# Patient Record
Sex: Male | Born: 1955
Health system: Southern US, Community
[De-identification: ages and names within clinical notes are randomized; demographics above are authoritative.]

## PROBLEM LIST (undated history)

## (undated) DIAGNOSIS — Z973 Presence of spectacles and contact lenses: Secondary | ICD-10-CM

## (undated) DIAGNOSIS — I340 Nonrheumatic mitral (valve) insufficiency: Secondary | ICD-10-CM

## (undated) DIAGNOSIS — E785 Hyperlipidemia, unspecified: Secondary | ICD-10-CM

## (undated) DIAGNOSIS — I10 Essential (primary) hypertension: Secondary | ICD-10-CM

## (undated) DIAGNOSIS — I351 Nonrheumatic aortic (valve) insufficiency: Secondary | ICD-10-CM

## (undated) DIAGNOSIS — K219 Gastro-esophageal reflux disease without esophagitis: Secondary | ICD-10-CM

## (undated) DIAGNOSIS — C801 Malignant (primary) neoplasm, unspecified: Secondary | ICD-10-CM

## (undated) DIAGNOSIS — R42 Dizziness and giddiness: Secondary | ICD-10-CM

## (undated) DIAGNOSIS — R011 Cardiac murmur, unspecified: Secondary | ICD-10-CM

## (undated) HISTORY — PX: OTHER SURGICAL HISTORY: SHX169

---

## 2013-08-12 ENCOUNTER — Other Ambulatory Visit (HOSPITAL_COMMUNITY): Payer: Self-pay | Admitting: Urology

## 2013-08-12 DIAGNOSIS — R972 Elevated prostate specific antigen [PSA]: Secondary | ICD-10-CM

## 2013-08-30 ENCOUNTER — Ambulatory Visit (HOSPITAL_COMMUNITY)
Admission: RE | Admit: 2013-08-30 | Discharge: 2013-08-30 | Disposition: A | Discharge status: Home / Self Care | Payer: 59 | Source: Ambulatory Visit | Attending: Urology | Admitting: Urology

## 2013-08-30 DIAGNOSIS — N402 Nodular prostate without lower urinary tract symptoms: Secondary | ICD-10-CM | POA: Insufficient documentation

## 2013-08-30 DIAGNOSIS — R972 Elevated prostate specific antigen [PSA]: Secondary | ICD-10-CM | POA: Insufficient documentation

## 2013-08-30 DIAGNOSIS — N400 Enlarged prostate without lower urinary tract symptoms: Secondary | ICD-10-CM | POA: Insufficient documentation

## 2013-08-30 LAB — CREATININE, SERUM
Creatinine, Ser: 1.43 mg/dL — ABNORMAL HIGH (ref 0.50–1.35)
GFR calc Af Amer: 62 mL/min — ABNORMAL LOW (ref >90)
GFR calc non Af Amer: 54 mL/min — ABNORMAL LOW (ref >90)

## 2013-08-30 MED ORDER — GADOBENATE DIMEGLUMINE 529 MG/ML IV SOLN
17.0000 mL | Freq: Once | INTRAVENOUS | Status: AC | PRN
  Administered 2013-08-30: 17 mL via INTRAVENOUS

## 2015-05-15 ENCOUNTER — Other Ambulatory Visit: Payer: Self-pay | Admitting: Urology

## 2015-05-18 ENCOUNTER — Other Ambulatory Visit: Payer: Self-pay | Admitting: Urology

## 2015-06-26 ENCOUNTER — Encounter (HOSPITAL_COMMUNITY)
Admission: RE | Admit: 2015-06-26 | Discharge: 2015-06-26 | Disposition: A | Discharge status: Home / Self Care | Payer: 59 | Source: Ambulatory Visit | Attending: Urology | Admitting: Urology

## 2015-06-26 ENCOUNTER — Encounter (HOSPITAL_COMMUNITY): Payer: Self-pay

## 2015-06-26 DIAGNOSIS — Z01812 Encounter for preprocedural laboratory examination: Secondary | ICD-10-CM | POA: Insufficient documentation

## 2015-06-26 HISTORY — DX: Presence of spectacles and contact lenses: Z97.3

## 2015-06-26 HISTORY — DX: Hyperlipidemia, unspecified: E78.5

## 2015-06-26 HISTORY — DX: Cardiac murmur, unspecified: R01.1

## 2015-06-26 HISTORY — DX: Dizziness and giddiness: R42

## 2015-06-26 HISTORY — DX: Nonrheumatic mitral (valve) insufficiency: I34.0

## 2015-06-26 HISTORY — DX: Malignant (primary) neoplasm, unspecified: C80.1

## 2015-06-26 HISTORY — DX: Nonrheumatic aortic (valve) insufficiency: I35.1

## 2015-06-26 HISTORY — DX: Gastro-esophageal reflux disease without esophagitis: K21.9

## 2015-06-26 HISTORY — DX: Essential (primary) hypertension: I10

## 2015-06-26 LAB — ABO/RH: ABO/RH(D): O POS

## 2015-06-26 LAB — BASIC METABOLIC PANEL
Anion gap: 10 (ref 5–15)
BUN: 33 mg/dL — ABNORMAL HIGH (ref 6–20)
CO2: 24 mmol/L (ref 22–32)
Calcium: 9.9 mg/dL (ref 8.9–10.3)
Chloride: 109 mmol/L (ref 101–111)
Creatinine, Ser: 1.27 mg/dL — ABNORMAL HIGH (ref 0.61–1.24)
GFR calc Af Amer: >60 mL/min (ref >60)
GFR calc non Af Amer: >60 mL/min (ref >60)
Glucose, Bld: 100 mg/dL — ABNORMAL HIGH (ref 65–99)
Potassium: 5 mmol/L (ref 3.5–5.1)
Sodium: 143 mmol/L (ref 135–145)

## 2015-06-26 LAB — CBC
HCT: 39 % (ref 39.0–52.0)
Hemoglobin: 13.2 g/dL (ref 13.0–17.0)
MCH: 31.2 pg (ref 26.0–34.0)
MCHC: 33.8 g/dL (ref 30.0–36.0)
MCV: 92.2 fL (ref 78.0–100.0)
Platelets: 213 10*3/uL (ref 150–400)
RBC: 4.23 MIL/uL (ref 4.22–5.81)
RDW: 12.1 % (ref 11.5–15.5)
WBC: 6 10*3/uL (ref 4.0–10.5)

## 2015-06-26 LAB — PSA: PSA: 14.5 ng/mL — ABNORMAL HIGH (ref 0.00–4.00)

## 2015-06-26 LAB — TYPE AND SCREEN
ABO/RH(D): O POS
Antibody Screen: NEGATIVE

## 2015-06-26 NOTE — Patient Instructions (Signed)
Steven Hanson  06/26/2015   Your procedure is scheduled on: Friday June 30, 2015  Report to [facility]  Entrance take [place]  elevators to 3rd floor to  [place] at 5:30 AM.  Call this number if you have problems the morning of surgery [phone]   Remember: ONLY 1 PERSON MAY GO WITH YOU TO SHORT STAY TO GET  READY MORNING OF [place].  Do not eat food or drink liquids :After Midnight.     Take these medicines the morning of surgery with A SIP OF WATER: NONE                               You may not have any metal on your body including hair pins and              piercings  Do not wear jewelry, lotions, powders or colognes, deodorant                           Men may shave face and neck.   Do not bring valuables to the hospital. [place].  Contacts, dentures or bridgework may not be worn into surgery.  Leave suitcase in the car. After surgery it may be brought to your room.             FOLLOW SURGEON'S INSTRUCTION IN REGARDS TO BOWEL PREPARATION PRIOR TO SURGICAL DATE   _____________________________________________________________________             [facility] - Preparing for Surgery Before surgery, you can play an important role.  Because skin is not sterile, your skin needs to be as free of germs as possible.  You can reduce the number of germs on your skin by washing with CHG (chlorahexidine gluconate) soap before surgery.  CHG is an antiseptic cleaner which kills germs and bonds with the skin to continue killing germs even after washing. Please DO NOT use if you have an allergy to CHG or antibacterial soaps.  If your skin becomes reddened/irritated stop using the CHG and inform your nurse when you arrive at Short Stay. Do not shave (including legs and underarms) for at least 48 hours prior to the first CHG shower.  You may shave your face/neck. Please follow these instructions  carefully:  1.  Shower with CHG Soap the night before surgery and the  morning of Surgery.  2.  If you choose to wash your hair, wash your hair first as usual with your  normal  shampoo.  3.  After you shampoo, rinse your hair and body thoroughly to remove the  shampoo.                           4.  Use CHG as you would any other liquid soap.  You can apply chg directly  to the skin and wash                       Gently with a scrungie or clean washcloth.  5.  Apply the CHG Soap to your body ONLY FROM THE NECK DOWN.   Do not use on face/  open                           Wound or open sores. Avoid contact with eyes, ears mouth and genitals (private parts).                       Wash face,  Genitals (private parts) with your normal soap.             6.  Wash thoroughly, paying special attention to the area where your surgery  will be performed.  7.  Thoroughly rinse your body with warm water from the neck down.  8.  DO NOT shower/wash with your normal soap after using and rinsing off  the CHG Soap.                9.  Pat yourself dry with a clean towel.            10.  Wear clean pajamas.            11.  Place clean sheets on your bed the night of your first shower and do not  sleep with pets. Day of Surgery : Do not apply any lotions/deodorants the morning of surgery.  Please wear clean clothes to the hospital/surgery center.  FAILURE TO FOLLOW THESE INSTRUCTIONS MAY RESULT IN THE CANCELLATION OF YOUR SURGERY PATIENT SIGNATURE_________________________________  NURSE SIGNATURE__________________________________  ________________________________________________________________________

## 2015-06-26 NOTE — Progress Notes (Signed)
H&P per Dr Wynonia Lawman per chart 11/11/2014  ECHO results per chart 10/17/2014  EKG per chart 11/11/2014

## 2015-06-27 LAB — URINE CULTURE: Culture: 2000

## 2015-06-27 NOTE — Progress Notes (Addendum)
BMP and PSA results in epic per PAT visit 06/26/2015 sent to Dr Louis Meckel

## 2015-06-29 NOTE — Anesthesia Preprocedure Evaluation (Addendum)
Anesthesia Evaluation  Patient identified by MRN, date of birth, ID band Patient awake    Reviewed: Allergy & Precautions, NPO status , Patient's Chart, lab work & pertinent test results  History of Anesthesia Complications Negative for: history of anesthetic complications  Airway Mallampati: II  TM Distance: >3 FB Neck ROM: Full    Dental  (+) Teeth Intact, Dental Advisory Given   Pulmonary neg pulmonary ROS,    Pulmonary exam normal        Cardiovascular hypertension, Pt. on medications Normal cardiovascular exam+ Valvular Problems/Murmurs MR and AI   Echo: Mild- mod MR, Mild- mod AI, nl EF.   Neuro/Psych negative neurological ROS  negative psych ROS   GI/Hepatic Neg liver ROS, GERD  Medicated,  Endo/Other  negative endocrine ROS  Renal/GU Renal InsufficiencyRenal disease     Musculoskeletal   Abdominal   Peds  Hematology   Anesthesia Other Findings   Reproductive/Obstetrics                         Anesthesia Physical Anesthesia Plan  ASA: III  Anesthesia Plan: General   Post-op Pain Management:    Induction: Intravenous  Airway Management Planned: Oral ETT  Additional Equipment:   Intra-op Plan:   Post-operative Plan: Extubation in OR  Informed Consent: I have reviewed the patients History and Physical, chart, labs and discussed the procedure including the risks, benefits and alternatives for the proposed anesthesia with the patient or authorized representative who has indicated his/her understanding and acceptance.   Dental advisory given  Plan Discussed with:   Anesthesia Plan Comments:        Anesthesia Quick Evaluation

## 2015-06-30 ENCOUNTER — Inpatient Hospital Stay (HOSPITAL_COMMUNITY): Payer: 59 | Admitting: Anesthesiology

## 2015-06-30 ENCOUNTER — Encounter (HOSPITAL_COMMUNITY): Payer: Self-pay

## 2015-06-30 ENCOUNTER — Encounter (HOSPITAL_COMMUNITY)
Admission: RE | Disposition: A | Discharge status: Home / Self Care | Payer: Self-pay | Source: Ambulatory Visit | Attending: Urology

## 2015-06-30 ENCOUNTER — Inpatient Hospital Stay (HOSPITAL_COMMUNITY)
Admission: RE | Admit: 2015-06-30 | Discharge: 2015-07-01 | DRG: 708 | Disposition: A | Discharge status: Home / Self Care | Payer: 59 | Source: Ambulatory Visit | Attending: Urology | Admitting: Urology

## 2015-06-30 DIAGNOSIS — Z79899 Other long term (current) drug therapy: Secondary | ICD-10-CM

## 2015-06-30 DIAGNOSIS — I10 Essential (primary) hypertension: Secondary | ICD-10-CM | POA: Diagnosis present

## 2015-06-30 DIAGNOSIS — K219 Gastro-esophageal reflux disease without esophagitis: Secondary | ICD-10-CM | POA: Diagnosis present

## 2015-06-30 DIAGNOSIS — Z01812 Encounter for preprocedural laboratory examination: Secondary | ICD-10-CM

## 2015-06-30 DIAGNOSIS — Z8249 Family history of ischemic heart disease and other diseases of the circulatory system: Secondary | ICD-10-CM

## 2015-06-30 DIAGNOSIS — C61 Malignant neoplasm of prostate: Secondary | ICD-10-CM | POA: Diagnosis present

## 2015-06-30 HISTORY — PX: LYMPH NODE DISSECTION: SHX5087

## 2015-06-30 HISTORY — PX: ROBOT ASSISTED LAPAROSCOPIC RADICAL PROSTATECTOMY: SHX5141

## 2015-06-30 LAB — HEMOGLOBIN AND HEMATOCRIT, BLOOD
HCT: 33.9 % — ABNORMAL LOW (ref 39.0–52.0)
Hemoglobin: 11.6 g/dL — ABNORMAL LOW (ref 13.0–17.0)

## 2015-06-30 SURGERY — ROBOTIC ASSISTED LAPAROSCOPIC RADICAL PROSTATECTOMY
Anesthesia: General

## 2015-06-30 MED ORDER — EPHEDRINE SULFATE 50 MG/ML IJ SOLN
INTRAMUSCULAR | Status: AC
  Filled 2015-06-30: qty 1

## 2015-06-30 MED ORDER — FENTANYL CITRATE (PF) 100 MCG/2ML IJ SOLN
INTRAMUSCULAR | Status: DC | PRN
  Administered 2015-06-30: 50 ug via INTRAVENOUS
  Administered 2015-06-30: 100 ug via INTRAVENOUS
  Administered 2015-06-30 (×3): 50 ug via INTRAVENOUS
  Administered 2015-06-30: 100 ug via INTRAVENOUS

## 2015-06-30 MED ORDER — LIDOCAINE HCL (CARDIAC) 20 MG/ML IV SOLN
INTRAVENOUS | Status: DC | PRN
  Administered 2015-06-30: 50 mg via INTRAVENOUS

## 2015-06-30 MED ORDER — SCOPOLAMINE 1 MG/3DAYS TD PT72
MEDICATED_PATCH | TRANSDERMAL | Status: AC
  Filled 2015-06-30: qty 1

## 2015-06-30 MED ORDER — SODIUM CHLORIDE 0.9 % IV BOLUS (SEPSIS)
1000.0000 mL | Freq: Once | INTRAVENOUS | Status: AC
  Administered 2015-06-30: 1000 mL via INTRAVENOUS

## 2015-06-30 MED ORDER — MIDAZOLAM HCL 2 MG/2ML IJ SOLN
INTRAMUSCULAR | Status: AC
  Filled 2015-06-30: qty 2

## 2015-06-30 MED ORDER — PANTOPRAZOLE SODIUM 40 MG PO TBEC
40.0000 mg | DELAYED_RELEASE_TABLET | Freq: Every day | ORAL | Status: DC
  Administered 2015-06-30 – 2015-07-01 (×2): 40 mg via ORAL
  Filled 2015-06-30 (×2): qty 1

## 2015-06-30 MED ORDER — HYDROMORPHONE HCL 1 MG/ML IJ SOLN
INTRAMUSCULAR | Status: AC
  Filled 2015-06-30: qty 1

## 2015-06-30 MED ORDER — DIPHENHYDRAMINE HCL 12.5 MG/5ML PO ELIX: 12.5000 mg | ORAL_SOLUTION | Freq: Four times a day (QID) | ORAL | Status: DC | PRN

## 2015-06-30 MED ORDER — EPHEDRINE SULFATE 50 MG/ML IJ SOLN
INTRAMUSCULAR | Status: DC | PRN
  Administered 2015-06-30: 10 mg via INTRAVENOUS

## 2015-06-30 MED ORDER — CEFAZOLIN SODIUM-DEXTROSE 2-3 GM-% IV SOLR
INTRAVENOUS | Status: AC
  Filled 2015-06-30: qty 50

## 2015-06-30 MED ORDER — ACETAMINOPHEN 500 MG PO TABS
1000.0000 mg | ORAL_TABLET | Freq: Four times a day (QID) | ORAL | Status: AC
Start: 2015-06-30 — End: 2015-07-01
  Administered 2015-06-30 – 2015-07-01 (×4): 1000 mg via ORAL
  Filled 2015-06-30 (×4): qty 2

## 2015-06-30 MED ORDER — FENTANYL CITRATE (PF) 250 MCG/5ML IJ SOLN
INTRAMUSCULAR | Status: AC
  Filled 2015-06-30: qty 5

## 2015-06-30 MED ORDER — PROPOFOL 10 MG/ML IV BOLUS
INTRAVENOUS | Status: AC
  Filled 2015-06-30: qty 40

## 2015-06-30 MED ORDER — BUPIVACAINE LIPOSOME 1.3 % IJ SUSP
20.0000 mL | Freq: Once | INTRAMUSCULAR | Status: AC
  Administered 2015-06-30: 20 mL
  Filled 2015-06-30: qty 20

## 2015-06-30 MED ORDER — PROMETHAZINE HCL 25 MG/ML IJ SOLN: 6.2500 mg | INTRAMUSCULAR | Status: DC | PRN

## 2015-06-30 MED ORDER — PROPOFOL 10 MG/ML IV BOLUS
INTRAVENOUS | Status: DC | PRN
  Administered 2015-06-30: 160 mg via INTRAVENOUS

## 2015-06-30 MED ORDER — CEFAZOLIN SODIUM-DEXTROSE 2-3 GM-% IV SOLR
2.0000 g | INTRAVENOUS | Status: AC
  Administered 2015-06-30: 2 g via INTRAVENOUS

## 2015-06-30 MED ORDER — PRAVASTATIN SODIUM 20 MG PO TABS
20.0000 mg | ORAL_TABLET | Freq: Every day | ORAL | Status: DC
  Administered 2015-06-30: 20 mg via ORAL
  Filled 2015-06-30: qty 1

## 2015-06-30 MED ORDER — HYDROMORPHONE HCL 1 MG/ML IJ SOLN
0.2500 mg | INTRAMUSCULAR | Status: DC | PRN
  Administered 2015-06-30 (×2): 0.5 mg via INTRAVENOUS
  Administered 2015-06-30: 0.25 mg via INTRAVENOUS
  Administered 2015-06-30: 0.5 mg via INTRAVENOUS
  Administered 2015-06-30: 0.25 mg via INTRAVENOUS

## 2015-06-30 MED ORDER — LACTATED RINGERS IV SOLN
INTRAVENOUS | Status: DC | PRN
  Administered 2015-06-30: 08:00:00 via INTRAVENOUS

## 2015-06-30 MED ORDER — SODIUM CHLORIDE 0.9 % IJ SOLN
INTRAMUSCULAR | Status: AC
  Filled 2015-06-30: qty 20

## 2015-06-30 MED ORDER — LIDOCAINE HCL (CARDIAC) 20 MG/ML IV SOLN
INTRAVENOUS | Status: AC
  Filled 2015-06-30: qty 5

## 2015-06-30 MED ORDER — OXYCODONE HCL 5 MG PO TABS
5.0000 mg | ORAL_TABLET | ORAL | Status: DC | PRN
  Administered 2015-06-30: 5 mg via ORAL
  Filled 2015-06-30: qty 1

## 2015-06-30 MED ORDER — BUPIVACAINE-EPINEPHRINE 0.25% -1:200000 IJ SOLN
INTRAMUSCULAR | Status: DC | PRN
  Administered 2015-06-30: 10 mL

## 2015-06-30 MED ORDER — KETOROLAC TROMETHAMINE 15 MG/ML IJ SOLN
15.0000 mg | Freq: Four times a day (QID) | INTRAMUSCULAR | Status: DC | PRN
  Administered 2015-07-01: 15 mg via INTRAVENOUS
  Filled 2015-06-30: qty 1

## 2015-06-30 MED ORDER — ONDANSETRON HCL 4 MG/2ML IJ SOLN
INTRAMUSCULAR | Status: DC | PRN
  Administered 2015-06-30: 4 mg via INTRAVENOUS

## 2015-06-30 MED ORDER — SUGAMMADEX SODIUM 500 MG/5ML IV SOLN
INTRAVENOUS | Status: DC | PRN
  Administered 2015-06-30: 350 mg via INTRAVENOUS

## 2015-06-30 MED ORDER — CIPROFLOXACIN IN D5W 400 MG/200ML IV SOLN
INTRAVENOUS | Status: AC
  Filled 2015-06-30: qty 200

## 2015-06-30 MED ORDER — BACITRACIN-NEOMYCIN-POLYMYXIN 400-5-5000 EX OINT
1.0000 "application " | TOPICAL_OINTMENT | Freq: Three times a day (TID) | CUTANEOUS | Status: DC | PRN
  Administered 2015-06-30: 1 via TOPICAL
  Filled 2015-06-30: qty 1

## 2015-06-30 MED ORDER — MIDAZOLAM HCL 5 MG/5ML IJ SOLN
INTRAMUSCULAR | Status: DC | PRN
  Administered 2015-06-30: 2 mg via INTRAVENOUS

## 2015-06-30 MED ORDER — CIPROFLOXACIN IN D5W 400 MG/200ML IV SOLN
400.0000 mg | INTRAVENOUS | Status: AC
  Administered 2015-06-30: 400 mg via INTRAVENOUS

## 2015-06-30 MED ORDER — LACTATED RINGERS IV SOLN
INTRAVENOUS | Status: DC | PRN
  Administered 2015-06-30 (×3): via INTRAVENOUS

## 2015-06-30 MED ORDER — HYDROCODONE-ACETAMINOPHEN 5-325 MG PO TABS: 1.0000 | ORAL_TABLET | Freq: Four times a day (QID) | ORAL | Status: DC | PRN

## 2015-06-30 MED ORDER — HYDROMORPHONE HCL 1 MG/ML IJ SOLN: 0.5000 mg | INTRAMUSCULAR | Status: DC | PRN

## 2015-06-30 MED ORDER — ONDANSETRON HCL 4 MG/2ML IJ SOLN
INTRAMUSCULAR | Status: AC
  Filled 2015-06-30: qty 2

## 2015-06-30 MED ORDER — ACETAMINOPHEN 10 MG/ML IV SOLN
1000.0000 mg | Freq: Four times a day (QID) | INTRAVENOUS | Status: DC
  Filled 2015-06-30 (×3): qty 100

## 2015-06-30 MED ORDER — SUGAMMADEX SODIUM 500 MG/5ML IV SOLN
INTRAVENOUS | Status: AC
  Filled 2015-06-30: qty 5

## 2015-06-30 MED ORDER — ROCURONIUM BROMIDE 100 MG/10ML IV SOLN
INTRAVENOUS | Status: DC | PRN
  Administered 2015-06-30 (×2): 20 mg via INTRAVENOUS
  Administered 2015-06-30: 30 mg via INTRAVENOUS
  Administered 2015-06-30: 50 mg via INTRAVENOUS

## 2015-06-30 MED ORDER — DEXTROSE-NACL 5-0.45 % IV SOLN
INTRAVENOUS | Status: DC
  Administered 2015-06-30 – 2015-07-01 (×3): via INTRAVENOUS

## 2015-06-30 MED ORDER — ONDANSETRON HCL 4 MG/2ML IJ SOLN
4.0000 mg | INTRAMUSCULAR | Status: DC | PRN
Start: 2015-06-30 — End: 2015-07-01

## 2015-06-30 MED ORDER — LACTATED RINGERS IR SOLN
Status: DC | PRN
  Administered 2015-06-30: 1000 mL

## 2015-06-30 MED ORDER — DIPHENHYDRAMINE HCL 50 MG/ML IJ SOLN: 12.5000 mg | Freq: Four times a day (QID) | INTRAMUSCULAR | Status: DC | PRN

## 2015-06-30 MED ORDER — SULFAMETHOXAZOLE-TRIMETHOPRIM 800-160 MG PO TABS: 1.0000 | ORAL_TABLET | Freq: Two times a day (BID) | ORAL | Status: DC

## 2015-06-30 MED ORDER — BUPIVACAINE-EPINEPHRINE (PF) 0.25% -1:200000 IJ SOLN
INTRAMUSCULAR | Status: AC
  Filled 2015-06-30: qty 30

## 2015-06-30 MED ORDER — SODIUM CHLORIDE 0.9 % IJ SOLN
INTRAMUSCULAR | Status: AC
  Filled 2015-06-30: qty 10

## 2015-06-30 MED ORDER — LOSARTAN POTASSIUM 50 MG PO TABS
50.0000 mg | ORAL_TABLET | Freq: Every day | ORAL | Status: DC
  Administered 2015-06-30 – 2015-07-01 (×2): 50 mg via ORAL
  Filled 2015-06-30 (×2): qty 1

## 2015-06-30 SURGICAL SUPPLY — 49 items
CATH FOLEY 2WAY SLVR 18FR 30CC (CATHETERS) ×3 IMPLANT
CATH ROBINSON RED A/P 16FR (CATHETERS) IMPLANT
CATH TIEMANN FOLEY 18FR 5CC (CATHETERS) ×3 IMPLANT
CHLORAPREP W/TINT 26ML (MISCELLANEOUS) ×3 IMPLANT
CLIP LIGATING HEM O LOK PURPLE (MISCELLANEOUS) ×6 IMPLANT
COVER SURGICAL LIGHT HANDLE (MISCELLANEOUS) ×3 IMPLANT
COVER TIP SHEARS 8 DVNC (MISCELLANEOUS) ×2 IMPLANT
COVER TIP SHEARS 8MM DA VINCI (MISCELLANEOUS) ×1
CUTTER ECHEON FLEX ENDO 45 340 (ENDOMECHANICALS) ×3 IMPLANT
DECANTER SPIKE VIAL GLASS SM (MISCELLANEOUS) ×3 IMPLANT
DRAPE ARM DVNC X/XI (DISPOSABLE) ×8 IMPLANT
DRAPE COLUMN DVNC XI (DISPOSABLE) ×2 IMPLANT
DRAPE DA VINCI XI ARM (DISPOSABLE) ×4
DRAPE DA VINCI XI COLUMN (DISPOSABLE) ×1
DRAPE LAPAROSCOPIC ABDOMINAL (DRAPES) IMPLANT
DRAPE SURG IRRIG POUCH 19X23 (DRAPES) ×3 IMPLANT
DRSG TEGADERM 4X4.75 (GAUZE/BANDAGES/DRESSINGS) ×3 IMPLANT
ELECT REM PT RETURN 9FT ADLT (ELECTROSURGICAL) ×3
ELECTRODE REM PT RTRN 9FT ADLT (ELECTROSURGICAL) ×2 IMPLANT
GAUZE SPONGE 2X2 8PLY STRL LF (GAUZE/BANDAGES/DRESSINGS) IMPLANT
GLOVE BIO SURGEON STRL SZ 6.5 (GLOVE) ×3 IMPLANT
GLOVE BIOGEL M STRL SZ7.5 (GLOVE) ×6 IMPLANT
GOWN STRL REUS W/TWL LRG LVL3 (GOWN DISPOSABLE) ×3 IMPLANT
GOWN STRL REUS W/TWL XL LVL3 (GOWN DISPOSABLE) ×6 IMPLANT
HEMOSTAT SURGICEL 4X8 (HEMOSTASIS) ×3 IMPLANT
HOLDER FOLEY CATH W/STRAP (MISCELLANEOUS) ×3 IMPLANT
IV LACTATED RINGERS 1000ML (IV SOLUTION) ×3 IMPLANT
LIQUID BAND (GAUZE/BANDAGES/DRESSINGS) ×3 IMPLANT
PACK ROBOT UROLOGY CUSTOM (CUSTOM PROCEDURE TRAY) ×3 IMPLANT
PAD POSITIONING PINK XL (MISCELLANEOUS) ×3 IMPLANT
RELOAD GREEN ECHELON 45 (STAPLE) ×3 IMPLANT
SEAL CANN UNIV 5-8 DVNC XI (MISCELLANEOUS) ×8 IMPLANT
SEAL XI 5MM-8MM UNIVERSAL (MISCELLANEOUS) ×4
SET TUBE IRRIG SUCTION NO TIP (IRRIGATION / IRRIGATOR) ×3 IMPLANT
SOLUTION ELECTROLUBE (MISCELLANEOUS) ×3 IMPLANT
SPONGE GAUZE 2X2 STER 10/PKG (GAUZE/BANDAGES/DRESSINGS)
STAPLER 45 WHITE RELOAD XI (STAPLE)
STAPLER 45 WHT RELOAD XI (STAPLE) IMPLANT
SUT ETHILON 3 0 PS 1 (SUTURE) ×3 IMPLANT
SUT MNCRL AB 4-0 PS2 18 (SUTURE) ×6 IMPLANT
SUT VIC AB 0 CT1 27 (SUTURE) ×1
SUT VIC AB 0 CT1 27XBRD ANTBC (SUTURE) ×2 IMPLANT
SUT VIC AB 2-0 SH 27 (SUTURE) ×1
SUT VIC AB 2-0 SH 27X BRD (SUTURE) ×2 IMPLANT
SUT VICRYL 0 UR6 27IN ABS (SUTURE) ×6 IMPLANT
SUT VLOC BARB 180 ABS3/0GR12 (SUTURE) ×6
SUTURE VLOC BRB 180 ABS3/0GR12 (SUTURE) ×4 IMPLANT
TOWEL OR NON WOVEN STRL DISP B (DISPOSABLE) ×6 IMPLANT
WATER STERILE IRR 1500ML POUR (IV SOLUTION) ×3 IMPLANT

## 2015-06-30 NOTE — H&P (Signed)
Reason For Visit Follow-up for prostate cancer discussion   History of Present Illness This is a 60 year old male referred by Dr. Leighton Ruff, M.D. He is on active surveillance for CaP.    The patient has had 2 negative prostate biopsies in [place]. The first biopsy was performed on 07/03/12, this was a 12 core biopsy. Findings include high-grade PIN in the right lateral apex, left medial base, and left medial mid sections. In addition, ASAP in the left lateral apex. Repeat biopsy was performed on 10/05/12. Findings include small focus of atypical glands in the right lateral apex.     Prostate MR, 3/15, prior to repeat biopsy did not reveal any suspicious areas.    Oncotype DX 4/15: places patient in low risk (76% of favorable pathology) - less favorable than D'Amico criteria.    PSA History:  11.35 on 04/03/15  9.37 on 09/26/14  8.92 on 03/25/14  7.82 (8%) on 08/03/13 (biopsy - gleason 6 in 3/19 cores)  6.45 on 07/15/14  4.9 on 06/2012   4.0 on 10/2011  2.77 on 03/2010  1.38 on 01/2008  1.42 on 03/2007    IPSS: 2, QoL 1  SHIM: 21   Prostate biopsy results  09/2013: Stage: T1c PSA: 7.82, Biopsy , 3 /19 cores positive: Right transitional zone and apex 3+3 (5%), left medial base 3+3 (<5%), left medial mid 3+3 (10%)  Prostate volume: 27 g  04/05/15:1/12 cores positive, left medial mid, Gleason 3+4 equal 7 (10% 4)    Interval:   The patient presents today for discussion of his intermediate risk prostate cancer. He recovered well from his prostate biopsy. He denies any lingering hematuria or pain. Denies any fevers or chills.   Past Medical History Problems  1. History of esophageal reflux (Z87.19) 2. History of hypertension (Z86.79)  Current Meds 1. Losartan Potassium 50 MG Oral Tablet;  Therapy: (Recorded:24Feb2015) to Recorded 2. Omega 3 CAPS;  Therapy: (Recorded:26Mar2015) to Recorded 3. Pravastatin Sodium 20 MG Oral Tablet;  Therapy:  (Recorded:24Feb2015) to Recorded 4. PriLOSEC OTC TBEC;  Therapy: (Recorded:24Feb2015) to Recorded  Allergies Medication  1. No Known Drug Allergies  Family History Problems  1. Family history of Aneurysm : Father 2. Family history of hypertension (Z82.49) : Mother, Father 3. Family history of stroke (Z82.3) : Mother  Social History Problems  1. Alcohol use 2. Caffeine use (F15.90) 3. Death in the family, father   age 27 4. Death in the family, mother   age 12 stroke 35. Four children 6. Married 7. Never a smoker 8. Occupation   Manufacturing engineer  Review of Systems No changes in pts bowel habits, neurological changes, or progressive lower urinary tract symptoms.      Physical Exam Constitutional: Well nourished and well developed . No acute distress.  ENT:. The ears and nose are normal in appearance.  Neck: The appearance of the neck is normal and no neck mass is present.  Pulmonary: No respiratory distress and normal respiratory rhythm and effort.  Cardiovascular: Heart rate and rhythm are normal . No peripheral edema.  Abdomen: The abdomen is soft and nontender. No masses are palpated. No CVA tenderness. No hernias are palpable. No hepatosplenomegaly noted.  Lymphatics: The femoral and inguinal nodes are not enlarged or tender.  Skin: Normal skin turgor, no visible rash and no visible skin lesions.  Neuro/Psych:. Mood and affect are appropriate.    Results/Data Urine [Data Includes: Last 1 Day]   [id number]  COLOR YELLOW   APPEARANCE CLEAR  SPECIFIC GRAVITY 1.020   pH 6.5   GLUCOSE NEGATIVE   BILIRUBIN NEGATIVE   KETONE NEGATIVE   BLOOD NEGATIVE   PROTEIN NEGATIVE   NITRITE NEGATIVE   LEUKOCYTE ESTERASE NEGATIVE    Normal urine analysis   Assessment Assessed  1. Prostate cancer (C61)  Plan Health Maintenance  1. UA With REFLEX; [Do Not Release]; Status:Complete;   DoneOQ:[id number] 03:50PM  Discussion/Summary The patient has a history of a  rising PSA, and has been rapidly rising over the past 24-36 months. Nearly 6 months ago, the patient had a prostate biopsy demonstrating Gleason 3+3 equal 6 and 3/19 cores. He was placed on active surveillance. Repeat biopsy demonstrated Gleason 3+4 equal 7 prostate cancer. The patient is reasonably young and very healthy. We discussed the treatment options for low volume intermediate risk prostate cancer. Ultimately, given the patient's young age, I recommended that he consider treatment. We discussed the treatment modalities, specifically focusing on surgery. We went over the [facility] nomogram with his variables. He understands that with surgery has a 99% survival rate over the next 10 years. He also understands that there is a proximal 20% chance of biochemical recurrence of the next 10 years. We also discussed brachytherapy. I explained to him the risk and benefits of both procedure, which there are many of. Ultimately, I think the patient has opted to have a robotic-assisted laparoscopic prostatectomy.      We discussed prostatectomy and specifically robotic prostatectomy with bilateral pelvic lymphadenectomy being the technique that I most commonly perform. I showed the patient on their abdomen the approximately 6 small incision (trocar) sites as well as presumed extraction sites with robotic approach as well as possible open incision sites should open conversion be necessary. We discussed peri-operative risks including bleeding, infection, deep vein thrombosis, pulmonary embolism, compartment syndrome, nuropathy / neuropraxia, heart attack, stroke, death, as well as long-term risks such as non-cure / need for additional therapy. We specifically addressed that the procedure would compromise urinary control leading to stress incontinence which typically resolves with time and pelvic rehabilitation (Kegel's, etc..), but can sometimes be permanent and require additional therapy including  surgery. We also specifically addressed sexual sequellae including significant erectile dysfunction which typically partially resolves with time but can also be permanent and require additional therapy including surgery.     We discussed the typical hospital course including usual 1-2 night hospitalization, discharge with foley catheter in place usually for 1-2 weeks before voiding trial as well as usually 2 week recovery until able to perform most non-strenuous activity and 6 weeks until able to return to most jobs and more strenuous activity such as exercise.

## 2015-06-30 NOTE — Op Note (Signed)
Preoperative diagnosis:  1. Prostate Cancer   Postoperative diagnosis:  1. same   Procedure: 1. Robotic assisted laparoscopic radical prostatectomy 2. Bilateral pelvic lymph node dissection  Surgeon: Ardis Hughs, MD First Assistant: Debbrah Alar, PA  Anesthesia: General  Complications: None  Intraoperative findings:   EBL: 650  Specimens:  #1.  Prostate and seminal vesicals #2.  Bilateral pelvic lymph nodes  Indication: Steven Hanson is a 60 y.o. patient with prostate cancer.  After reviewing the management options for treatment, he elected to proceed with the removal of his prostate. We have discussed the potential benefits and risks of the procedure, side effects of the proposed treatment, the likelihood of the patient achieving the goals of the procedure, and any potential problems that might occur during the procedure or recuperation. Informed consent has been obtained.  Description of procedure: [name] posterior plane was adherent to the rectal wall likely due to the repeat biopsies.  In particular, the capsule was violated on the patient's left posterior plane in the mid portion.  I did suture the prostate in this area once out of the body to try and minimize the risk of any positive margins.   The patient was consented in the preoperative holding area. He is in brought back to the operating room placed the table in supine position. General anesthesia was then induced and endotracheal tube was inserted. He was then placed in dorsolithotomy position and placed in steep Trendelenburg. He was then prepped and draped in the routine sterile fashion. We then began by making a 12 mm incision supraumbilical midline incision the skin down through into the peritoneum. Then placed a 12 mm trocar. I then inflated the abdomen and inserted the 0 robotic lens. We then placed 2 additional a 8 millimeter trochars in the patient's left lower abdomen proximally 9 cm apart and 2  trochars on the patient's right lower abdomen, one was an 8 mm trocar and the one most lateral was a 12 mm trocar which was used as the assistant port. A 5 mm trocar was placed by triangulating the 2 right lateral ports as a second assistant port. These ports were all placed under visual guidance. Once the ports were noted to be satisfactory position the robot was docked. We started with the 0 lens, monopolar scissors in the right hand and the [place] forceps the left hand as well as a fenestrated grasper as the third arm on the left-hand side.   We began our dissection the posterior plane incising the peritoneum at the level of the vas deferens. Isolated the left vas deferens and dissected it proximally towards the spermatic cord for 5 cm prior to ligating it. Then used this as traction to isolate the left the seminal vesicle which was then undressed bluntly and completely dissected out, all vessels were cauterized with a combination of bipolar and the monopolar scissors. We then turned our attention to the right side and similarly dissected out the right vas deferens and seminal vesicle. Once the SVs had been freed, we turned our attention to the posterior plane and bluntly dissected the tissue between the rectum and the posterior wall of the prostate bluntly out towards the apex.    At this point the bladder was taken down starting at the urachal remnant with a combination of both blunt dissection and sharp dissection using monopolar cautery the bladder was dropped down in the usual fashion to the medial umbilical ligaments laterally and the dorsal vein of the prostate  anteriorly creating our space of Retzius. We then turned our attention to the endopelvic fascia which was incised laterally starting on the patient's right-hand side the levator muscles were pushed off the prostate laterally up towards the dorsal vein complex on the right-hand side. This process was then repeated on the left-hand side and a  nice notch was created for the dorsal vein. I then used a 62mm stapler to staple the dorsal vein.   We then located the bladder neck at the vesicoprostatic junction and using the monopolar scissors dissected down through the perivesical tissues and the bladder neck down to the prostatic urethra. The catheter was then deflated and pulled through our urethral opening and then used to retract the prostate anteriorly for the posterior bladder neck dissection. Once through the bladder neck and into the posterior plane of the prostate, the SVs were brought through the opening. The left pedicle was then isolated and systematically ligated with Weck clips and scissors. The nerve bundle was then peeled off the posterior lateral aspect of the prostate and bluntly dissected away off the prostate.  This was then repeated on the right side.    I then came down through the dorsal venous complex anteriorly down to the membranous urethra using the monopolar. Once down to the urethra, it was transected sharply and the apex of the prostate was then dissected off the levator and rectourethralis muscles. Once the apex of the prostate had been dissected free we came back to the base of the prostate and bluntly push the rectum and nerve vascular bundle off the prostate the patient's left and used clips on the patient's right to free the prostate. Once the prostate was free it was placed off to the side. The pelvis was then irrigated with normal saline and noted to be relatively hemostatic.  Attention was then turned to the right pelvic sidewall. The fibrofatty tissue between the external iliac vein, confluence of the iliac vessels, hypogastric artery, and Cooper's ligament was dissected free from the pelvic sidewall with care to preserve the obturator nerve. Weck clips were used for lymphostasis and hemostasis. An identical procedure was performed on the contralateral side and the lymphatic packets were removed for permanent  pathologic analysis.  The prostate and both lymph node tissues were placed in the Endo Catch bag and the string brought to the 5 mm port.    The vesicourethral anastomosis was then completed with 2 interlocking 3-0 V. lock sutures running the anastomosis in the 6:00 position to the 12:00 position on each side and then tying it off on the top. The final catheter was then passed through the patient's urethra and into the bladder and 120 cc was instilled into the bladder to test the anastomosis. As there was no leak a 59 [place] Blake drain was passed through the left lateral port and placed around the vesicourethral anastomosis. A 12 mm assistant port on the right lateral side was then closed with 0 Vicryl with the help of the [employer] needle. The 12 mm midline infraumbilical incision was then extended another centimeter taken down and the fascia opened to remove the Endo Catch bag with the prostate specimen. The fascia was then closed with a 0 Vicryl and all skin ports were closed with 4-0 Monocryl in a subcutaneous fashion. Dermabond glue was then applied to the incisions. The drain was then secured to the skin with a 0 nylon stitch and dressing applied.   At the end of the case all laps  needles and sponges had been accounted for. There no immediate complications. The patient returned to the PACU in stable condition.

## 2015-06-30 NOTE — Anesthesia Postprocedure Evaluation (Signed)
Anesthesia Post Note  Patient: Steven Hanson  Procedure(s) Performed: Procedure(s) (LRB): ROBOTIC ASSISTED LAPAROSCOPIC RADICAL PROSTATECTOMY (N/A) BILATERAL PELVIC LYMPH NODE DISSECTION (Bilateral)  Patient location during evaluation: PACU Anesthesia Type: General Level of consciousness: sedated Pain management: pain level controlled Vital Signs Assessment: post-procedure vital signs reviewed and stable Respiratory status: spontaneous breathing and respiratory function stable Cardiovascular status: stable Anesthetic complications: no    Last Vitals:  Filed Vitals:   06/30/15 1145 06/30/15 1200  BP: 137/85 132/86  Pulse: 74 85  Temp:    Resp: 14 12    Last Pain:  Filed Vitals:   06/30/15 1216  PainSc: 5                  Suleyman Ehrman DANIEL

## 2015-06-30 NOTE — Transfer of Care (Signed)
Immediate Anesthesia Transfer of Care Note  Patient: Steven Hanson  Procedure(s) Performed: Procedure(s): ROBOTIC ASSISTED LAPAROSCOPIC RADICAL PROSTATECTOMY (N/A) BILATERAL PELVIC LYMPH NODE DISSECTION (Bilateral)  Patient Location: PACU  Anesthesia Type:General  Level of Consciousness: awake, alert , oriented and patient cooperative  Airway & Oxygen Therapy: Patient Spontanous Breathing and Patient connected to face mask oxygen  Post-op Assessment: Report given to RN, Post -op Vital signs reviewed and stable and Patient moving all extremities X 4  Post vital signs: stable  Last Vitals:  Filed Vitals:   06/30/15 0528  BP: 159/85  Pulse: 77  Temp: 36.3 C  Resp: 16    Complications: No apparent anesthesia complications

## 2015-06-30 NOTE — Interval H&P Note (Signed)
History and Physical Interval Note:  06/30/2015 5:01 AM  Steven Hanson  has presented today for surgery, with the diagnosis of PROSTATE CANCER  The various methods of treatment have been discussed with the patient and family. After consideration of risks, benefits and other options for treatment, the patient has consented to  Procedure(s): ROBOTIC ASSISTED LAPAROSCOPIC RADICAL PROSTATECTOMY (N/A) BILATERAL PELVIC LYMPH NODE DISSECTION (Bilateral) as a surgical intervention .  [name] history has been reviewed, patient examined, no change in status, stable for surgery.  I have reviewed the patient's chart and labs.  Questions were answered to the patient's satisfaction.     Louis Meckel W

## 2015-06-30 NOTE — Discharge Instructions (Signed)

## 2015-06-30 NOTE — Anesthesia Procedure Notes (Signed)
Procedure Name: Intubation Performed by: Gean Maidens Pre-anesthesia Checklist: Patient identified, Emergency Drugs available, Suction available, Patient being monitored and Timeout performed Patient Re-evaluated:Patient Re-evaluated prior to inductionOxygen Delivery Method: Circle system utilized Preoxygenation: Pre-oxygenation with 100% oxygen Intubation Type: IV induction Ventilation: Mask ventilation without difficulty Laryngoscope Size: Mac and 4 Grade View: Grade II Tube type: Oral Tube size: 7.5 mm Airway Equipment and Method: Stylet Placement Confirmation: ETT inserted through vocal cords under direct vision,  positive ETCO2,  CO2 detector and breath sounds checked- equal and bilateral Secured at: 23 cm Tube secured with: Tape Dental Injury: Teeth and Oropharynx as per pre-operative assessment

## 2015-07-01 LAB — BASIC METABOLIC PANEL
Anion gap: 9 (ref 5–15)
BUN: 14 mg/dL (ref 6–20)
CO2: 26 mmol/L (ref 22–32)
Calcium: 8.6 mg/dL — ABNORMAL LOW (ref 8.9–10.3)
Chloride: 101 mmol/L (ref 101–111)
Creatinine, Ser: 1.28 mg/dL — ABNORMAL HIGH (ref 0.61–1.24)
GFR calc Af Amer: >60 mL/min (ref >60)
GFR calc non Af Amer: >60 mL/min (ref >60)
Glucose, Bld: 104 mg/dL — ABNORMAL HIGH (ref 65–99)
Potassium: 4.1 mmol/L (ref 3.5–5.1)
Sodium: 136 mmol/L (ref 135–145)

## 2015-07-01 LAB — HEMOGLOBIN AND HEMATOCRIT, BLOOD
HCT: 28.7 % — ABNORMAL LOW (ref 39.0–52.0)
Hemoglobin: 9.7 g/dL — ABNORMAL LOW (ref 13.0–17.0)

## 2015-07-01 NOTE — Progress Notes (Addendum)
Patient is A&Ox 4 and ambulatory.patient denied complaints at this time. Spouse at bedside. Instruction provided regarding discharge. Questions and concerns were denied. Dressing applied to JP Site, CDI. Pt discharged with foley leg bag and supplies for home. Patient and spouse encouraged to monitor for S&s.

## 2015-07-01 NOTE — Discharge Summary (Signed)
Physician Discharge Summary  Patient ID: Steven Hanson MRN: [id number] DOB/AGE: March 18, 1956 60 y.o.  Admit date: 06/30/2015 Discharge date: 07/01/2015  Admission Diagnoses:  Discharge Diagnoses:  Active Problems:   Prostate cancer [facility])   Discharged Condition: good  Hospital Course: Patient was admitted after uneventful robotic-assisted laparoscopic prostatectomy performed by Dr. Louis Meckel. [name] hospital course was unremarkable. He was able to ambulate. Pain was well managed. Postoperative hemoglobin was acceptable. JP drainage was 60 mL over the overnight shift with scant drainage when I evaluated the patient. JP drain was removed. The patient was tolerating clears and ambulating well.  Consults: None  Significant Diagnostic Studies: None significant  Treatments: surgery: Robotic-assisted laparoscopic radical retropubic prostatectomy.  Discharge Exam: Blood pressure 114/79, pulse 70, temperature 98.3 F (36.8 C), temperature source Oral, resp. rate 20, height 5\' 9"  (1.753 m), weight 83.008 kg (183 lb), SpO2 99 %. Well-developed well-nourished male in no acute distress Normal respiratory effort Cardiac regular rate and rhythm Abdomen soft without distention. No active bleeding from any incision. Drain site okay Genitourinary: Light to moderate pink urine draining well. No other abnormalities appreciated. Strategies: No tenderness or edema  Disposition: Final discharge disposition not confirmed  Discharge Instructions    Discharge patient    Complete by:  As directed   After lunch today if patient continues to make good progress. If any concerns please contact urology service.            Medication List    STOP taking these medications        Fish Oil 1000 MG Caps      TAKE these medications        HYDROcodone-acetaminophen 5-325 MG tablet  Commonly known as:  NORCO  Take 1-2 tablets by mouth every 6 (six) hours as needed.     losartan 50 MG tablet   Commonly known as:  COZAAR  Take 50 mg by mouth daily.     METAMUCIL PO  Take 1 tablet by mouth daily.     omeprazole 20 MG capsule  Commonly known as:  PRILOSEC  Take 20 mg by mouth daily.     pravastatin 20 MG tablet  Commonly known as:  PRAVACHOL  Take 20 mg by mouth at bedtime.     sulfamethoxazole-trimethoprim 800-160 MG tablet  Commonly known as:  BACTRIM DS,SEPTRA DS  Take 1 tablet by mouth 2 (two) times daily. Start the day prior to foley removal appointment           Follow-up Information    Follow up with Karen Kays, NP On 07/07/2015.   Specialty:  Nurse Practitioner   Why:  at 9:30   Contact information:   [address] [place] [address] [phone]       Signed: Bernestine Amass 07/01/2015, 8:00 AM

## 2016-11-22 DIAGNOSIS — Z0000 Encounter for general adult medical examination without abnormal findings: Secondary | ICD-10-CM | POA: Diagnosis not present

## 2016-11-22 DIAGNOSIS — I10 Essential (primary) hypertension: Secondary | ICD-10-CM | POA: Diagnosis not present

## 2016-11-22 DIAGNOSIS — I340 Nonrheumatic mitral (valve) insufficiency: Secondary | ICD-10-CM | POA: Diagnosis not present

## 2016-11-22 DIAGNOSIS — N183 Chronic kidney disease, stage 3 (moderate): Secondary | ICD-10-CM | POA: Diagnosis not present

## 2016-12-10 DIAGNOSIS — D225 Melanocytic nevi of trunk: Secondary | ICD-10-CM | POA: Diagnosis not present

## 2016-12-10 DIAGNOSIS — Z1283 Encounter for screening for malignant neoplasm of skin: Secondary | ICD-10-CM | POA: Diagnosis not present

## 2016-12-10 DIAGNOSIS — D485 Neoplasm of uncertain behavior of skin: Secondary | ICD-10-CM | POA: Diagnosis not present

## 2016-12-20 DIAGNOSIS — L988 Other specified disorders of the skin and subcutaneous tissue: Secondary | ICD-10-CM | POA: Diagnosis not present

## 2016-12-20 DIAGNOSIS — D485 Neoplasm of uncertain behavior of skin: Secondary | ICD-10-CM | POA: Diagnosis not present

## 2017-02-17 DIAGNOSIS — J302 Other seasonal allergic rhinitis: Secondary | ICD-10-CM | POA: Diagnosis not present

## 2017-02-17 DIAGNOSIS — J029 Acute pharyngitis, unspecified: Secondary | ICD-10-CM | POA: Diagnosis not present

## 2017-03-20 DIAGNOSIS — Z23 Encounter for immunization: Secondary | ICD-10-CM | POA: Diagnosis not present

## 2017-05-26 DIAGNOSIS — E7800 Pure hypercholesterolemia, unspecified: Secondary | ICD-10-CM | POA: Diagnosis not present

## 2017-05-26 DIAGNOSIS — I10 Essential (primary) hypertension: Secondary | ICD-10-CM | POA: Diagnosis not present

## 2017-05-26 DIAGNOSIS — N183 Chronic kidney disease, stage 3 (moderate): Secondary | ICD-10-CM | POA: Diagnosis not present

## 2017-06-20 DIAGNOSIS — D225 Melanocytic nevi of trunk: Secondary | ICD-10-CM | POA: Diagnosis not present

## 2017-06-20 DIAGNOSIS — Z1283 Encounter for screening for malignant neoplasm of skin: Secondary | ICD-10-CM | POA: Diagnosis not present

## 2017-06-20 DIAGNOSIS — L905 Scar conditions and fibrosis of skin: Secondary | ICD-10-CM | POA: Diagnosis not present

## 2017-07-10 DIAGNOSIS — J019 Acute sinusitis, unspecified: Secondary | ICD-10-CM | POA: Diagnosis not present

## 2017-07-16 DIAGNOSIS — L905 Scar conditions and fibrosis of skin: Secondary | ICD-10-CM | POA: Diagnosis not present

## 2017-09-16 DIAGNOSIS — Z8546 Personal history of malignant neoplasm of prostate: Secondary | ICD-10-CM | POA: Diagnosis not present

## 2017-11-28 DIAGNOSIS — K219 Gastro-esophageal reflux disease without esophagitis: Secondary | ICD-10-CM | POA: Diagnosis not present

## 2017-11-28 DIAGNOSIS — N183 Chronic kidney disease, stage 3 (moderate): Secondary | ICD-10-CM | POA: Diagnosis not present

## 2017-11-28 DIAGNOSIS — I10 Essential (primary) hypertension: Secondary | ICD-10-CM | POA: Diagnosis not present

## 2017-11-28 DIAGNOSIS — E7800 Pure hypercholesterolemia, unspecified: Secondary | ICD-10-CM | POA: Diagnosis not present

## 2017-11-28 DIAGNOSIS — Z0000 Encounter for general adult medical examination without abnormal findings: Secondary | ICD-10-CM | POA: Diagnosis not present

## 2017-11-29 ENCOUNTER — Other Ambulatory Visit: Payer: Self-pay | Admitting: Family Medicine

## 2017-11-29 DIAGNOSIS — R0989 Other specified symptoms and signs involving the circulatory and respiratory systems: Secondary | ICD-10-CM

## 2017-11-29 DIAGNOSIS — Z8249 Family history of ischemic heart disease and other diseases of the circulatory system: Secondary | ICD-10-CM

## 2017-12-01 DIAGNOSIS — E875 Hyperkalemia: Secondary | ICD-10-CM | POA: Diagnosis not present

## 2017-12-08 ENCOUNTER — Other Ambulatory Visit: Payer: 59

## 2017-12-12 ENCOUNTER — Other Ambulatory Visit: Payer: 59

## 2017-12-12 ENCOUNTER — Ambulatory Visit: Payer: 59

## 2017-12-16 ENCOUNTER — Ambulatory Visit
Admission: RE | Admit: 2017-12-16 | Discharge: 2017-12-16 | Disposition: A | Discharge status: Home / Self Care | Payer: 59 | Source: Ambulatory Visit | Attending: Family Medicine | Admitting: Family Medicine

## 2017-12-16 DIAGNOSIS — I714 Abdominal aortic aneurysm, without rupture: Secondary | ICD-10-CM | POA: Diagnosis not present

## 2017-12-16 DIAGNOSIS — R0989 Other specified symptoms and signs involving the circulatory and respiratory systems: Secondary | ICD-10-CM

## 2017-12-16 DIAGNOSIS — I6523 Occlusion and stenosis of bilateral carotid arteries: Secondary | ICD-10-CM | POA: Diagnosis not present

## 2017-12-16 DIAGNOSIS — Z8249 Family history of ischemic heart disease and other diseases of the circulatory system: Secondary | ICD-10-CM

## 2017-12-19 DIAGNOSIS — D225 Melanocytic nevi of trunk: Secondary | ICD-10-CM | POA: Diagnosis not present

## 2017-12-19 DIAGNOSIS — Z1283 Encounter for screening for malignant neoplasm of skin: Secondary | ICD-10-CM | POA: Diagnosis not present

## 2018-03-10 DIAGNOSIS — I10 Essential (primary) hypertension: Secondary | ICD-10-CM | POA: Diagnosis not present

## 2018-03-10 DIAGNOSIS — H5203 Hypermetropia, bilateral: Secondary | ICD-10-CM | POA: Diagnosis not present

## 2018-03-10 DIAGNOSIS — H40013 Open angle with borderline findings, low risk, bilateral: Secondary | ICD-10-CM | POA: Diagnosis not present

## 2018-03-12 DIAGNOSIS — Z8546 Personal history of malignant neoplasm of prostate: Secondary | ICD-10-CM | POA: Diagnosis not present

## 2018-03-20 DIAGNOSIS — Z23 Encounter for immunization: Secondary | ICD-10-CM | POA: Diagnosis not present

## 2018-03-24 DIAGNOSIS — Z8546 Personal history of malignant neoplasm of prostate: Secondary | ICD-10-CM | POA: Diagnosis not present

## 2018-03-24 DIAGNOSIS — N393 Stress incontinence (female) (male): Secondary | ICD-10-CM | POA: Diagnosis not present

## 2018-06-19 DIAGNOSIS — Z1283 Encounter for screening for malignant neoplasm of skin: Secondary | ICD-10-CM | POA: Diagnosis not present

## 2018-06-19 DIAGNOSIS — D225 Melanocytic nevi of trunk: Secondary | ICD-10-CM | POA: Diagnosis not present

## 2018-06-19 DIAGNOSIS — H61002 Unspecified perichondritis of left external ear: Secondary | ICD-10-CM | POA: Diagnosis not present

## 2018-07-13 DIAGNOSIS — H53453 Other localized visual field defect, bilateral: Secondary | ICD-10-CM | POA: Diagnosis not present

## 2018-07-29 DIAGNOSIS — M12271 Villonodular synovitis (pigmented), right ankle and foot: Secondary | ICD-10-CM | POA: Diagnosis not present

## 2018-07-29 DIAGNOSIS — M25572 Pain in left ankle and joints of left foot: Secondary | ICD-10-CM | POA: Diagnosis not present

## 2018-07-29 DIAGNOSIS — M25571 Pain in right ankle and joints of right foot: Secondary | ICD-10-CM | POA: Diagnosis not present

## 2018-07-29 DIAGNOSIS — M12272 Villonodular synovitis (pigmented), left ankle and foot: Secondary | ICD-10-CM | POA: Diagnosis not present

## 2018-07-29 DIAGNOSIS — M722 Plantar fascial fibromatosis: Secondary | ICD-10-CM | POA: Diagnosis not present

## 2018-08-19 DIAGNOSIS — M722 Plantar fascial fibromatosis: Secondary | ICD-10-CM | POA: Diagnosis not present

## 2018-08-19 DIAGNOSIS — M71572 Other bursitis, not elsewhere classified, left ankle and foot: Secondary | ICD-10-CM | POA: Diagnosis not present

## 2018-09-22 DIAGNOSIS — Z8546 Personal history of malignant neoplasm of prostate: Secondary | ICD-10-CM | POA: Diagnosis not present

## 2018-09-29 DIAGNOSIS — Z8546 Personal history of malignant neoplasm of prostate: Secondary | ICD-10-CM | POA: Diagnosis not present

## 2018-10-16 DIAGNOSIS — H6123 Impacted cerumen, bilateral: Secondary | ICD-10-CM | POA: Diagnosis not present

## 2019-10-08 ENCOUNTER — Other Ambulatory Visit (HOSPITAL_COMMUNITY): Payer: Self-pay | Admitting: Family Medicine

## 2019-10-08 DIAGNOSIS — I340 Nonrheumatic mitral (valve) insufficiency: Secondary | ICD-10-CM

## 2019-11-01 ENCOUNTER — Other Ambulatory Visit: Payer: Self-pay

## 2019-11-01 ENCOUNTER — Ambulatory Visit (HOSPITAL_COMMUNITY): Payer: 59 | Attending: Cardiology

## 2019-11-01 DIAGNOSIS — I340 Nonrheumatic mitral (valve) insufficiency: Secondary | ICD-10-CM | POA: Diagnosis present

## 2019-12-03 ENCOUNTER — Other Ambulatory Visit: Payer: Self-pay

## 2019-12-03 ENCOUNTER — Ambulatory Visit: Payer: 59 | Admitting: Cardiology

## 2019-12-03 VITALS — BP 134/82 | HR 82 | Ht 70.0 in | Wt 174.8 lb

## 2019-12-03 DIAGNOSIS — I10 Essential (primary) hypertension: Secondary | ICD-10-CM

## 2019-12-03 DIAGNOSIS — E785 Hyperlipidemia, unspecified: Secondary | ICD-10-CM

## 2019-12-03 DIAGNOSIS — I340 Nonrheumatic mitral (valve) insufficiency: Secondary | ICD-10-CM

## 2019-12-03 DIAGNOSIS — I77819 Aortic ectasia, unspecified site: Secondary | ICD-10-CM | POA: Diagnosis not present

## 2019-12-03 DIAGNOSIS — I351 Nonrheumatic aortic (valve) insufficiency: Secondary | ICD-10-CM

## 2019-12-03 MED ORDER — LOSARTAN POTASSIUM 100 MG PO TABS: 100.0000 mg | ORAL_TABLET | Freq: Every day | ORAL | 3 refills | Status: DC

## 2019-12-03 MED ORDER — ROSUVASTATIN CALCIUM 10 MG PO TABS: 10.0000 mg | ORAL_TABLET | Freq: Every day | ORAL | 3 refills | Status: DC

## 2019-12-03 NOTE — Patient Instructions (Signed)
Medication Instructions:  Stop Pravastatin Start Crestor 10 mg daily Increase Losartan to 100 mg daily *If you need a refill on your cardiac medications before your next appointment, please call your pharmacy*   Lab Work: Bmet to be done in 1 week   Fri 7/2    Testing/Procedures: Schedule chest cta   Follow-Up: At [facility], you and your health needs are our priority.  As part of our continuing mission to provide you with exceptional heart care, we have created designated Provider Care Teams.  These Care Teams include your primary Cardiologist (physician) and Advanced Practice Providers (APPs -  Physician Assistants and Nurse Practitioners) who all work together to provide you with the care you need, when you need it.  We recommend signing up for the patient portal called "MyChart".  Sign up information is provided on this After Visit Summary.  MyChart is used to connect with patients for Virtual Visits (Telemedicine).  Patients are able to view lab/test results, encounter notes, upcoming appointments, etc.  Non-urgent messages can be sent to your provider as well.   To learn more about what you can do with MyChart, go to [email or web address].    Your next appointment:  3 months   The format for your next appointment: Office   Provider:  Dr.Schumann

## 2019-12-03 NOTE — Progress Notes (Signed)
Cardiology Office Note:    Date:  12/04/2019   ID:  Steven Hanson, DOB June 09, 1956, MRN [id number]  PCP:  Leighton Ruff, MD  Cardiologist:  No primary care provider on file.  Electrophysiologist:  None   Referring MD: Leighton Ruff, MD   Chief Complaint  Patient presents with  . Cardiac Valve Problem    History of Present Illness:     Steven Hanson is a 64 y.o. male with a hx of mitral regurgitation, aortic regurgitation, hyperlipidemia, hypertension, prostate cancer he was referred by Dr. Drema Dallas for evaluation of valvular heart disease.  He reports that he is very active, runs 4 to 5 miles 5 days/week.  He denies any chest pain, dyspnea, lightheadedness, syncope, palpitations, or lower extremity edema.  Reports he checks his BP about 5 times per week, has typically been in the 130s over 80s, rarely above 140/98.  No smoking history.  No history of heart disease in his immediate family.  Echocardiogram on 11/01/2019 showed LVEF 60 to 65%, redundant mitral valve cords, mild MR, mild AI, mild PS, ascending aorta dilatation measuring 41 mm.      Past Medical History:  Diagnosis Date  . Aortic valve insufficiency    11/11/2014 per H&P per Dr Wynonia Lawman   . Cancer ([place])   . GERD (gastroesophageal reflux disease)   . Heart murmur   . Hyperlipidemia    per H&P per Dr Wynonia Lawman 11/11/2014  . Hypertension   . Mitral valve regurgitation    per H&P per Dr Wynonia Lawman 11/11/2014  . Vertigo   . Wears glasses     Past Surgical History:  Procedure Laterality Date  . cyst removed      pt not sure which toe or on which foot [phone]  . LYMPH NODE DISSECTION Bilateral 06/30/2015   Procedure: BILATERAL PELVIC LYMPH NODE DISSECTION;  Surgeon: Ardis Hughs, MD;  Location: [facility];  Service: Urology;  Laterality: Bilateral;  . ROBOT ASSISTED LAPAROSCOPIC RADICAL PROSTATECTOMY N/A 06/30/2015   Procedure: ROBOTIC ASSISTED LAPAROSCOPIC RADICAL PROSTATECTOMY;  Surgeon: Ardis Hughs, MD;  Location:  [facility];  Service: Urology;  Laterality: N/A;    Current Medications: Current Meds  Medication Sig  . omeprazole (PRILOSEC) 20 MG capsule Take 20 mg by mouth daily.  . [DISCONTINUED] losartan (COZAAR) 50 MG tablet Take 50 mg by mouth daily.   . [DISCONTINUED] pravastatin (PRAVACHOL) 20 MG tablet Take 20 mg by mouth at bedtime.      Allergies:   Patient has no known allergies.   Social History   Socioeconomic History  . Marital status: Widowed    Spouse name: Not on file  . Number of children: Not on file  . Years of education: Not on file  . Highest education level: Not on file  Occupational History  . Not on file  Tobacco Use  . Smoking status: Never Smoker  . Smokeless tobacco: Never Used  Substance and Sexual Activity  . Alcohol use: Yes    Comment: several glasses of wine on weekend  . Drug use: No  . Sexual activity: Not on file  Other Topics Concern  . Not on file  Social History Narrative  . Not on file   Social Determinants of Health   Financial Resource Strain:   . Difficulty of Paying Living Expenses:   Food Insecurity:   . Worried About Charity fundraiser in the Last Year:   . Arboriculturist in the Last Year:   [employer]  Needs:   . Lack of Transportation (Medical):   Marland Kitchen Lack of Transportation (Non-Medical):   Physical Activity:   . Days of Exercise per Week:   . Minutes of Exercise per Session:   Stress:   . Feeling of Stress :   Social Connections:   . Frequency of Communication with Friends and Family:   . Frequency of Social Gatherings with Friends and Family:   . Attends Religious Services:   . Active Member of Clubs or Organizations:   . Attends Archivist Meetings:   Marland Kitchen Marital Status:      Family History: [name] family history is not on file.  ROS:   Please see the history of present illness.     All other systems reviewed and are negative.  EKGs/Labs/Other Studies Reviewed:    The following studies were  reviewed today:   EKG:  EKG is ordered today.  The ekg ordered today demonstrates normal sinus rhythm, rate 82, no ST/T abnormalities  Recent Labs: No results found for requested labs within last 8760 hours.  Recent Lipid Panel No results found for: CHOL, TRIG, HDL, CHOLHDL, VLDL, LDLCALC, LDLDIRECT  Physical Exam:    VS:  BP 134/82   Pulse 82   Ht 5\' 10"  (1.778 m)   Wt 174 lb 12.8 oz (79.3 kg)   SpO2 97%   BMI 25.08 kg/m     Wt Readings from Last 3 Encounters:  12/03/19 174 lb 12.8 oz (79.3 kg)  06/30/15 183 lb (83 kg)  06/26/15 183 lb (83 kg)     GEN: Well nourished, well developed in no acute distress HEENT: Normal NECK: No JVD; No carotid bruits LYMPHATICS: No lymphadenopathy CARDIAC: RRR, no murmurs, rubs, gallops RESPIRATORY:  Clear to auscultation without rales, wheezing or rhonchi  ABDOMEN: Soft, non-tender, non-distended MUSCULOSKELETAL:  No edema; No deformity  SKIN: Warm and dry NEUROLOGIC:  Alert and oriented x 3 PSYCHIATRIC:  Normal affect   ASSESSMENT:    1. Aortic dilatation (HCC)   2. Aortic valve insufficiency, etiology of cardiac valve disease unspecified   3. Mitral valve insufficiency, unspecified etiology   4. Essential hypertension   5. Hyperlipidemia, unspecified hyperlipidemia type    PLAN:     Valvular heart disease: Mild AI, mild MR, mild PS on TTE 11/01/2019.  Will monitor, plan repeat echocardiogram in 2 years  Aortic dilatation: Ascending aorta measured 41 mm on echocardiogram 11/01/2019.  Will check CTA chest  Hypertension: BP above goal less than 130/80 given aortic dilatation, will increase losartan to 100 mg daily.  Asked patient to monitor BP daily for next 2 weeks and call with results.  Will check BMP in 1 week  Hyperlipidemia: LDL 148 on 10/07/2019.  On pravastatin 20 mg daily.  10-year ASCVD risk score is 14%.  Recommend switching to high intensity statin, will switch to rosuvastatin 10 mg daily  RTC in 3  months   Medication Adjustments/Labs and Tests Ordered: Current medicines are reviewed at length with the patient today.  Concerns regarding medicines are outlined above.  Orders Placed This Encounter  Procedures  . CT ANGIO CHEST AORTA W/CM & OR WO/CM  . Basic metabolic panel  . EKG 12-Lead   Meds ordered this encounter  Medications  . losartan (COZAAR) 100 MG tablet    Sig: Take 1 tablet (100 mg total) by mouth daily.    Dispense:  90 tablet    Refill:  3  . rosuvastatin (CRESTOR) 10 MG tablet  Sig: Take 1 tablet (10 mg total) by mouth daily.    Dispense:  90 tablet    Refill:  3    Patient Instructions  Medication Instructions:  Stop Pravastatin Start Crestor 10 mg daily Increase Losartan to 100 mg daily *If you need a refill on your cardiac medications before your next appointment, please call your pharmacy*   Lab Work: Bmet to be done in 1 week   Fri 7/2    Testing/Procedures: Schedule chest cta   Follow-Up: At [facility], you and your health needs are our priority.  As part of our continuing mission to provide you with exceptional heart care, we have created designated Provider Care Teams.  These Care Teams include your primary Cardiologist (physician) and Advanced Practice Providers (APPs -  Physician Assistants and Nurse Practitioners) who all work together to provide you with the care you need, when you need it.  We recommend signing up for the patient portal called "MyChart".  Sign up information is provided on this After Visit Summary.  MyChart is used to connect with patients for Virtual Visits (Telemedicine).  Patients are able to view lab/test results, encounter notes, upcoming appointments, etc.  Non-urgent messages can be sent to your provider as well.   To learn more about what you can do with MyChart, go to [email or web address].    Your next appointment:  3 months   The format for your next appointment: Office   Provider:   Dr.Cicily Bonano      Signed, Donato Heinz, MD  12/04/2019 1:58 PM    [place]

## 2019-12-10 LAB — BASIC METABOLIC PANEL
BUN/Creatinine Ratio: 13 (ref 10–24)
BUN: 16 mg/dL (ref 8–27)
CO2: 22 mmol/L (ref 20–29)
Calcium: 9.4 mg/dL (ref 8.6–10.2)
Chloride: 99 mmol/L (ref 96–106)
Creatinine, Ser: 1.27 mg/dL (ref 0.76–1.27)
GFR calc Af Amer: 70 mL/min/{1.73_m2} (ref >59)
GFR calc non Af Amer: 60 mL/min/{1.73_m2} (ref >59)
Glucose: 100 mg/dL — ABNORMAL HIGH (ref 65–99)
Potassium: 5.4 mmol/L — ABNORMAL HIGH (ref 3.5–5.2)
Sodium: 134 mmol/L (ref 134–144)

## 2019-12-14 ENCOUNTER — Other Ambulatory Visit: Payer: Self-pay | Admitting: *Deleted

## 2019-12-14 DIAGNOSIS — E875 Hyperkalemia: Secondary | ICD-10-CM

## 2019-12-14 DIAGNOSIS — I10 Essential (primary) hypertension: Secondary | ICD-10-CM

## 2019-12-14 MED ORDER — LOSARTAN POTASSIUM 100 MG PO TABS: 50.0000 mg | ORAL_TABLET | Freq: Every day | ORAL | 3 refills | Status: DC

## 2019-12-16 ENCOUNTER — Ambulatory Visit (INDEPENDENT_AMBULATORY_CARE_PROVIDER_SITE_OTHER)
Admission: RE | Admit: 2019-12-16 | Discharge: 2019-12-16 | Disposition: A | Discharge status: Home / Self Care | Payer: 59 | Source: Ambulatory Visit | Attending: Cardiology | Admitting: Cardiology

## 2019-12-16 ENCOUNTER — Other Ambulatory Visit: Payer: Self-pay

## 2019-12-16 DIAGNOSIS — I77819 Aortic ectasia, unspecified site: Secondary | ICD-10-CM | POA: Diagnosis not present

## 2019-12-16 MED ORDER — IOHEXOL 350 MG/ML SOLN
100.0000 mL | Freq: Once | INTRAVENOUS | Status: AC | PRN
  Administered 2019-12-16: 100 mL via INTRAVENOUS

## 2019-12-20 ENCOUNTER — Other Ambulatory Visit: Payer: Self-pay | Admitting: *Deleted

## 2019-12-20 DIAGNOSIS — R911 Solitary pulmonary nodule: Secondary | ICD-10-CM

## 2019-12-20 DIAGNOSIS — IMO0001 Reserved for inherently not codable concepts without codable children: Secondary | ICD-10-CM

## 2019-12-24 ENCOUNTER — Other Ambulatory Visit: Payer: Self-pay

## 2019-12-24 ENCOUNTER — Other Ambulatory Visit: Payer: Self-pay | Admitting: Cardiology

## 2019-12-24 ENCOUNTER — Telehealth: Payer: Self-pay

## 2019-12-24 DIAGNOSIS — E875 Hyperkalemia: Secondary | ICD-10-CM

## 2019-12-24 DIAGNOSIS — I10 Essential (primary) hypertension: Secondary | ICD-10-CM

## 2019-12-24 LAB — BASIC METABOLIC PANEL
BUN/Creatinine Ratio: 17 (ref 10–24)
BUN: 23 mg/dL (ref 8–27)
CO2: 23 mmol/L (ref 20–29)
Calcium: 9.9 mg/dL (ref 8.6–10.2)
Chloride: 95 mmol/L — ABNORMAL LOW (ref 96–106)
Creatinine, Ser: 1.39 mg/dL — ABNORMAL HIGH (ref 0.76–1.27)
GFR calc Af Amer: 62 mL/min/{1.73_m2} (ref >59)
GFR calc non Af Amer: 54 mL/min/{1.73_m2} — ABNORMAL LOW (ref >59)
Glucose: 94 mg/dL (ref 65–99)
Potassium: 5.9 mmol/L (ref 3.5–5.2)
Sodium: 133 mmol/L — ABNORMAL LOW (ref 134–144)

## 2019-12-24 MED ORDER — AMLODIPINE BESYLATE 5 MG PO TABS: 5.0000 mg | ORAL_TABLET | Freq: Every day | ORAL | 2 refills | Status: DC

## 2019-12-24 NOTE — Telephone Encounter (Signed)
Patient returning call.

## 2019-12-24 NOTE — Telephone Encounter (Signed)
Received a call from [employer] calling to report elevated potassium 5.9.Spoke to pharmacist Raquel she advised to hold Losartan for 2 days then decrease to 50 mg daily.Repeat bmet on Monday 7/19.

## 2019-12-24 NOTE — Telephone Encounter (Signed)
Spoke with patient.  He has been asymptomatic, denies any muscle weakness or pain, no syncope or lightheadedness or palpitations.  Discussed if developed these symptoms should go to the ED.  Agree with holding losartan, recommend discontinuing and will start on amlodipine.  Discussed high potassium foods to avoid.  Will recheck BMET on Monday 7/19.

## 2019-12-24 NOTE — Telephone Encounter (Signed)
Spoke to patient he stated he has been taking Losartan 50 mg daily for the past 2 weeks or more.Advised to hold for 2 days.Repeat bmet on Mon 5/19.

## 2019-12-27 ENCOUNTER — Other Ambulatory Visit: Payer: Self-pay

## 2019-12-27 DIAGNOSIS — E875 Hyperkalemia: Secondary | ICD-10-CM

## 2019-12-27 LAB — BASIC METABOLIC PANEL
BUN/Creatinine Ratio: 15 (ref 10–24)
BUN: 19 mg/dL (ref 8–27)
CO2: 25 mmol/L (ref 20–29)
Calcium: 9.6 mg/dL (ref 8.6–10.2)
Chloride: 99 mmol/L (ref 96–106)
Creatinine, Ser: 1.25 mg/dL (ref 0.76–1.27)
GFR calc Af Amer: 71 mL/min/{1.73_m2} (ref >59)
GFR calc non Af Amer: 61 mL/min/{1.73_m2} (ref >59)
Glucose: 99 mg/dL (ref 65–99)
Potassium: 5.2 mmol/L (ref 3.5–5.2)
Sodium: 136 mmol/L (ref 134–144)

## 2020-02-27 NOTE — Progress Notes (Signed)
Cardiology Office Note:    Date:  03/02/2020   ID:  Steven Hanson, DOB 1956-05-01, MRN [id number]  PCP:  Leighton Ruff, MD  Cardiologist:  No primary care provider on file.  Electrophysiologist:  None   Referring MD: Leighton Ruff, MD   Chief Complaint  Patient presents with  . Hypertension    History of Present Illness:     Steven Hanson is a 64 y.o. male with a hx of mitral regurgitation, aortic regurgitation, hyperlipidemia, hypertension, prostate cancer who presents for follow-up.  He was referred by Dr. Drema Dallas for evaluation of valvular heart disease, initially seen on 12/03/2019.  He reports that he is very active, runs 4 to 5 miles 5 days/week.  He denies any chest pain, dyspnea, lightheadedness, syncope, palpitations, or lower extremity edema.  Reports he checks his BP about 5 times per week, has typically been in the 130s over 80s, rarely above 140/98.  No smoking history.  No history of heart disease in his immediate family.  Echocardiogram on 11/01/2019 showed LVEF 60 to 65%, redundant mitral valve cords, mild MR, mild AI, mild PS, ascending aorta dilatation measuring 41 mm.  CTA chest on 12/16/2019 showed max diameter of ascending aorta measured 37 mm, also with small pulmonary nodules.  Patanase of heart given to mom  Since last clinic visit, he reports that he has been doing well.  Brought BP log, BP has been 126 -146/81-95 over last week.  He denies any chest pain, dyspnea, lightheadedness, syncope, or lower extremity edema.  States that he continues to run 4 miles 5 days/week, denies any exertional symptoms.   Past Medical History:  Diagnosis Date  . Aortic valve insufficiency    11/11/2014 per H&P per Dr Wynonia Lawman   . Cancer ([place])   . GERD (gastroesophageal reflux disease)   . Heart murmur   . Hyperlipidemia    per H&P per Dr Wynonia Lawman 11/11/2014  . Hypertension   . Mitral valve regurgitation    per H&P per Dr Wynonia Lawman 11/11/2014  . Vertigo   . Wears glasses     Past  Surgical History:  Procedure Laterality Date  . cyst removed      pt not sure which toe or on which foot [phone]  . LYMPH NODE DISSECTION Bilateral 06/30/2015   Procedure: BILATERAL PELVIC LYMPH NODE DISSECTION;  Surgeon: Ardis Hughs, MD;  Location: [facility];  Service: Urology;  Laterality: Bilateral;  . ROBOT ASSISTED LAPAROSCOPIC RADICAL PROSTATECTOMY N/A 06/30/2015   Procedure: ROBOTIC ASSISTED LAPAROSCOPIC RADICAL PROSTATECTOMY;  Surgeon: Ardis Hughs, MD;  Location: [facility];  Service: Urology;  Laterality: N/A;    Current Medications: Current Meds  Medication Sig  . amLODipine (NORVASC) 10 MG tablet Take 1 tablet (10 mg total) by mouth daily.  Marland Kitchen omeprazole (PRILOSEC) 20 MG capsule Take 20 mg by mouth daily.  . rosuvastatin (CRESTOR) 10 MG tablet Take 1 tablet (10 mg total) by mouth daily.  . [DISCONTINUED] amLODipine (NORVASC) 5 MG tablet Take 1 tablet (5 mg total) by mouth daily.     Allergies:   Patient has no known allergies.   Social History   Socioeconomic History  . Marital status: Widowed    Spouse name: Not on file  . Number of children: Not on file  . Years of education: Not on file  . Highest education level: Not on file  Occupational History  . Not on file  Tobacco Use  . Smoking status: Never Smoker  . Smokeless tobacco: Never  Used  Substance and Sexual Activity  . Alcohol use: Yes    Comment: several glasses of wine on weekend  . Drug use: No  . Sexual activity: Not on file  Other Topics Concern  . Not on file  Social History Narrative  . Not on file   Social Determinants of Health   Financial Resource Strain:   . Difficulty of Paying Living Expenses: Not on file  Food Insecurity:   . Worried About Charity fundraiser in the Last Year: Not on file  . Ran Out of Food in the Last Year: Not on file  Transportation Needs:   . Lack of Transportation (Medical): Not on file  . Lack of Transportation (Non-Medical): Not on file  Physical  Activity:   . Days of Exercise per Week: Not on file  . Minutes of Exercise per Session: Not on file  Stress:   . Feeling of Stress : Not on file  Social Connections:   . Frequency of Communication with Friends and Family: Not on file  . Frequency of Social Gatherings with Friends and Family: Not on file  . Attends Religious Services: Not on file  . Active Member of Clubs or Organizations: Not on file  . Attends Archivist Meetings: Not on file  . Marital Status: Not on file     Family History: [name] family history is not on file.  ROS:   Please see the history of present illness.     All other systems reviewed and are negative.  EKGs/Labs/Other Studies Reviewed:    The following studies were reviewed today:   EKG:  EKG is not ordered today.  The ekg ordered at prior clinic visit demonstrates normal sinus rhythm, rate 82, no ST/T abnormalities  Recent Labs: 12/27/2019: BUN 19; Creatinine, Ser 1.25; Potassium 5.2; Sodium 136  Recent Lipid Panel No results found for: CHOL, TRIG, HDL, CHOLHDL, VLDL, LDLCALC, LDLDIRECT  Physical Exam:    VS:  BP (!) 150/98   Pulse 80   Ht 5\' 10"  (1.778 m)   Wt 175 lb 12.8 oz (79.7 kg)   SpO2 99%   BMI 25.22 kg/m     Wt Readings from Last 3 Encounters:  03/02/20 175 lb 12.8 oz (79.7 kg)  12/03/19 174 lb 12.8 oz (79.3 kg)  06/30/15 183 lb (83 kg)     GEN: Well nourished, well developed in no acute distress HEENT: Normal NECK: No JVD; No carotid bruits LYMPHATICS: No lymphadenopathy CARDIAC: RRR, no murmurs, rubs, gallops RESPIRATORY:  Clear to auscultation without rales, wheezing or rhonchi  ABDOMEN: Soft, non-tender, non-distended MUSCULOSKELETAL:  No edema; No deformity  SKIN: Warm and dry NEUROLOGIC:  Alert and oriented x 3 PSYCHIATRIC:  Normal affect   ASSESSMENT:    1. Essential hypertension   2. Hyperlipidemia, unspecified hyperlipidemia type   3. Aortic valve insufficiency, etiology of cardiac valve  disease unspecified   4. Mitral valve insufficiency, unspecified etiology   5. Aortic dilatation (HCC)   6. Lung nodule < 6cm on CT    PLAN:     Valvular heart disease: Mild AI, mild MR, mild PS on TTE 11/01/2019.  Will monitor, plan repeat echocardiogram in 2 years  Aortic dilatation: Ascending aorta measured 41 mm on echocardiogram 11/01/2019.  CTA chest on 12/16/2019 showed max diameter of ascending aorta measured 37 mm  Hypertension: Developed hyperkalemia (K 5.9) on losartan, was switched to amlodipine 5 mg daily.  BP above goal, will increase to amlodipine  10 mg daily.  Asked patient to monitor BP daily for next 2 weeks and call with results.  Hyperlipidemia: LDL 148 on 10/07/2019.  10-year ASCVD risk score is 14%.  Switch from pravastatin 20 mg to rosuvastatin 10 mg daily in June 2021.  LDL 109 on 01/07/2020.  Check calcium score to guide how aggressive to be in lowering cholesterol.  Lung nodule: Small lung nodules on recent CT.  Plan CT chest without contrast in 1 year to follow.  RTC in 6 months    Medication Adjustments/Labs and Tests Ordered: Current medicines are reviewed at length with the patient today.  Concerns regarding medicines are outlined above.  Orders Placed This Encounter  Procedures  . CT CARDIAC SCORING   Meds ordered this encounter  Medications  . amLODipine (NORVASC) 10 MG tablet    Sig: Take 1 tablet (10 mg total) by mouth daily.    Dispense:  90 tablet    Refill:  3    Dose increase    Patient Instructions  Medication Instructions:  INCREASE amlodipine to 10 mg daily  *If you need a refill on your cardiac medications before your next appointment, please call your pharmacy*  Testing/Procedures: CT coronary calcium score. This test is done at [address]. This is $150 out of pocket.   Coronary CalciumScan A coronary calcium scan is an imaging test used to look for deposits of calcium and other fatty materials (plaques) in the  inner lining of the blood vessels of the heart (coronary arteries). These deposits of calcium and plaques can partly clog and narrow the coronary arteries without producing any symptoms or warning signs. This puts a person at risk for a heart attack. This test can detect these deposits before symptoms develop. Tell a health care provider about:  Any allergies you have.  All medicines you are taking, including vitamins, herbs, eye drops, creams, and over-the-counter medicines.  Any problems you or family members have had with anesthetic medicines.  Any blood disorders you have.  Any surgeries you have had.  Any medical conditions you have.  Whether you are pregnant or may be pregnant. What are the risks? Generally, this is a safe procedure. However, problems may occur, including:  Harm to a pregnant woman and her unborn baby. This test involves the use of radiation. Radiation exposure can be dangerous to a pregnant woman and her unborn baby. If you are pregnant, you generally should not have this procedure done.  Slight increase in the risk of cancer. This is because of the radiation involved in the test. What happens before the procedure? No preparation is needed for this procedure. What happens during the procedure?  You will undress and remove any jewelry around your neck or chest.  You will put on a hospital gown.  Sticky electrodes will be placed on your chest. The electrodes will be connected to an electrocardiogram (ECG) machine to record a tracing of the electrical activity of your heart.  A CT scanner will take pictures of your heart. During this time, you will be asked to lie still and hold your breath for 2-3 seconds while a picture of your heart is being taken. The procedure may vary among health care providers and hospitals. What happens after the procedure?  You can get dressed.  You can return to your normal activities.  It is up to you to get the results of your  test. Ask your health care provider, or the department that  is doing the test, when your results will be ready. Summary  A coronary calcium scan is an imaging test used to look for deposits of calcium and other fatty materials (plaques) in the inner lining of the blood vessels of the heart (coronary arteries).  Generally, this is a safe procedure. Tell your health care provider if you are pregnant or may be pregnant.  No preparation is needed for this procedure.  A CT scanner will take pictures of your heart.  You can return to your normal activities after the scan is done. This information is not intended to replace advice given to you by your health care provider. Make sure you discuss any questions you have with your health care provider. Document Released: 11/23/2007 Document Revised: 04/15/2016 Document Reviewed: 04/15/2016 Elsevier Interactive Patient Education  2017 [place]: At [facility], you and your health needs are our priority.  As part of our continuing mission to provide you with exceptional heart care, we have created designated Provider Care Teams.  These Care Teams include your primary Cardiologist (physician) and Advanced Practice Providers (APPs -  Physician Assistants and Nurse Practitioners) who all work together to provide you with the care you need, when you need it.  We recommend signing up for the patient portal called "MyChart".  Sign up information is provided on this After Visit Summary.  MyChart is used to connect with patients for Virtual Visits (Telemedicine).  Patients are able to view lab/test results, encounter notes, upcoming appointments, etc.  Non-urgent messages can be sent to your provider as well.   To learn more about what you can do with MyChart, go to [email or web address].    Your next appointment:   6 month(s)  The format for your next appointment:   In Person  Provider:   Oswaldo Milian, MD   Other  Instructions Please check your blood pressure at home daily, write it down.  Call the office or send message via Mychart with the readings in 2 weeks for Dr. Gardiner Rhyme to review.       Signed, Donato Heinz, MD  03/02/2020 8:54 AM    Steven Hanson

## 2020-03-02 ENCOUNTER — Encounter: Payer: Self-pay | Admitting: Cardiology

## 2020-03-02 ENCOUNTER — Other Ambulatory Visit: Payer: Self-pay

## 2020-03-02 ENCOUNTER — Ambulatory Visit: Payer: 59 | Admitting: Cardiology

## 2020-03-02 VITALS — BP 150/98 | HR 80 | Ht 70.0 in | Wt 175.8 lb

## 2020-03-02 DIAGNOSIS — I10 Essential (primary) hypertension: Secondary | ICD-10-CM | POA: Diagnosis not present

## 2020-03-02 DIAGNOSIS — E785 Hyperlipidemia, unspecified: Secondary | ICD-10-CM | POA: Diagnosis not present

## 2020-03-02 DIAGNOSIS — I340 Nonrheumatic mitral (valve) insufficiency: Secondary | ICD-10-CM | POA: Diagnosis not present

## 2020-03-02 DIAGNOSIS — I351 Nonrheumatic aortic (valve) insufficiency: Secondary | ICD-10-CM | POA: Diagnosis not present

## 2020-03-02 DIAGNOSIS — I77819 Aortic ectasia, unspecified site: Secondary | ICD-10-CM

## 2020-03-02 DIAGNOSIS — R911 Solitary pulmonary nodule: Secondary | ICD-10-CM

## 2020-03-02 DIAGNOSIS — IMO0001 Reserved for inherently not codable concepts without codable children: Secondary | ICD-10-CM

## 2020-03-02 MED ORDER — AMLODIPINE BESYLATE 10 MG PO TABS: 10.0000 mg | ORAL_TABLET | Freq: Every day | ORAL | 3 refills | Status: DC

## 2020-03-02 NOTE — Patient Instructions (Signed)
Medication Instructions:  INCREASE amlodipine to 10 mg daily  *If you need a refill on your cardiac medications before your next appointment, please call your pharmacy*  Testing/Procedures: CT coronary calcium score. This test is done at [address]. This is $150 out of pocket.   Coronary CalciumScan A coronary calcium scan is an imaging test used to look for deposits of calcium and other fatty materials (plaques) in the inner lining of the blood vessels of the heart (coronary arteries). These deposits of calcium and plaques can partly clog and narrow the coronary arteries without producing any symptoms or warning signs. This puts a person at risk for a heart attack. This test can detect these deposits before symptoms develop. Tell a health care provider about:  Any allergies you have.  All medicines you are taking, including vitamins, herbs, eye drops, creams, and over-the-counter medicines.  Any problems you or family members have had with anesthetic medicines.  Any blood disorders you have.  Any surgeries you have had.  Any medical conditions you have.  Whether you are pregnant or may be pregnant. What are the risks? Generally, this is a safe procedure. However, problems may occur, including:  Harm to a pregnant woman and her unborn baby. This test involves the use of radiation. Radiation exposure can be dangerous to a pregnant woman and her unborn baby. If you are pregnant, you generally should not have this procedure done.  Slight increase in the risk of cancer. This is because of the radiation involved in the test. What happens before the procedure? No preparation is needed for this procedure. What happens during the procedure?  You will undress and remove any jewelry around your neck or chest.  You will put on a hospital gown.  Sticky electrodes will be placed on your chest. The electrodes will be connected to an electrocardiogram (ECG) machine to  record a tracing of the electrical activity of your heart.  A CT scanner will take pictures of your heart. During this time, you will be asked to lie still and hold your breath for 2-3 seconds while a picture of your heart is being taken. The procedure may vary among health care providers and hospitals. What happens after the procedure?  You can get dressed.  You can return to your normal activities.  It is up to you to get the results of your test. Ask your health care provider, or the department that is doing the test, when your results will be ready. Summary  A coronary calcium scan is an imaging test used to look for deposits of calcium and other fatty materials (plaques) in the inner lining of the blood vessels of the heart (coronary arteries).  Generally, this is a safe procedure. Tell your health care provider if you are pregnant or may be pregnant.  No preparation is needed for this procedure.  A CT scanner will take pictures of your heart.  You can return to your normal activities after the scan is done. This information is not intended to replace advice given to you by your health care provider. Make sure you discuss any questions you have with your health care provider. Document Released: 11/23/2007 Document Revised: 04/15/2016 Document Reviewed: 04/15/2016 Elsevier Interactive Patient Education  2017 [place]: At [facility], you and your health needs are our priority.  As part of our continuing mission to provide you with exceptional heart care, we have created designated Provider Care Teams.  These Care Teams include your primary Cardiologist (physician) and Advanced Practice Providers (APPs -  Physician Assistants and Nurse Practitioners) who all work together to provide you with the care you need, when you need it.  We recommend signing up for the patient portal called "MyChart".  Sign up information is provided on this After Visit Summary.  MyChart  is used to connect with patients for Virtual Visits (Telemedicine).  Patients are able to view lab/test results, encounter notes, upcoming appointments, etc.  Non-urgent messages can be sent to your provider as well.   To learn more about what you can do with MyChart, go to [email or web address].    Your next appointment:   6 month(s)  The format for your next appointment:   In Person  Provider:   Oswaldo Milian, MD   Other Instructions Please check your blood pressure at home daily, write it down.  Call the office or send message via Mychart with the readings in 2 weeks for Dr. Gardiner Rhyme to review.

## 2020-03-16 ENCOUNTER — Other Ambulatory Visit: Payer: 59

## 2020-03-17 ENCOUNTER — Telehealth: Payer: Self-pay | Admitting: Cardiology

## 2020-03-17 NOTE — Telephone Encounter (Signed)
Patient called to give his BP reading for the past two weeks as instructed: 9/24 133/89 9/25 155/88 9/27 143/86 9/28 119/80 9/29 128/84  10/1 115/75 10/2 133/86 10/4 117/84 10/5 118/78  10/6 116/78 10/7 120/82 10/8 122/74

## 2020-03-17 NOTE — Telephone Encounter (Signed)
Will route to primary nurse and Dr. Gardiner Rhyme.

## 2020-03-19 NOTE — Telephone Encounter (Signed)
BP appears controlled, continue current meds

## 2020-03-20 NOTE — Telephone Encounter (Signed)
Patient aware and verbalized understanding. °

## 2020-03-21 ENCOUNTER — Other Ambulatory Visit: Payer: Self-pay

## 2020-03-21 ENCOUNTER — Ambulatory Visit (INDEPENDENT_AMBULATORY_CARE_PROVIDER_SITE_OTHER)
Admission: RE | Admit: 2020-03-21 | Discharge: 2020-03-21 | Disposition: A | Discharge status: Home / Self Care | Payer: Self-pay | Source: Ambulatory Visit | Attending: Cardiology | Admitting: Cardiology

## 2020-03-21 DIAGNOSIS — E785 Hyperlipidemia, unspecified: Secondary | ICD-10-CM

## 2020-03-22 ENCOUNTER — Other Ambulatory Visit: Payer: Self-pay | Admitting: *Deleted

## 2020-03-22 MED ORDER — ROSUVASTATIN CALCIUM 20 MG PO TABS: 20.0000 mg | ORAL_TABLET | Freq: Every day | ORAL | 3 refills | Status: DC

## 2020-09-01 ENCOUNTER — Ambulatory Visit: Payer: 59 | Admitting: Cardiology

## 2020-09-10 NOTE — Progress Notes (Deleted)
Cardiology Office Note:    Date:  09/11/2020   ID:  Steven Hanson, DOB 1955-07-19, MRN [id number]  PCP:  Kristen Loader, FNP  Cardiologist:  No primary care provider on file.  Electrophysiologist:  None   Referring MD: Leighton Ruff, MD   Chief Complaint  Patient presents with  . Hypertension    History of Present Illness:     Steven Hanson is a 65 y.o. male with a hx of mitral regurgitation, aortic regurgitation, hyperlipidemia, hypertension, prostate cancer who presents for follow-up.  He was referred by Dr. Drema Dallas for evaluation of valvular heart disease, initially seen on 12/03/2019.  He reports that he is very active, runs 4 to 5 miles 5 days/week.  He denies any chest pain, dyspnea, lightheadedness, syncope, palpitations, or lower extremity edema.  Reports he checks his BP about 5 times per week, has typically been in the 130s over 80s, rarely above 140/98.  No smoking history.  No history of heart disease in his immediate family.  Echocardiogram on 11/01/2019 showed LVEF 60 to 65%, redundant mitral valve cords, mild MR, mild AI, mild PS, ascending aorta dilatation measuring 41 mm.  CTA chest on 12/16/2019 showed max diameter of ascending aorta measured 37 mm, also with small pulmonary nodules.  Calcium score on 03/21/2020 was 192 (73rd percentile).  Since last clinic visit,  Lipid panel  he reports that he has been doing well.  Brought BP log, BP has been 126 -146/81-95 over last week.  He denies any chest pain, dyspnea, lightheadedness, syncope, or lower extremity edema.  States that he continues to run 4 miles 5 days/week, denies any exertional symptoms.   Past Medical History:  Diagnosis Date  . Aortic valve insufficiency    11/11/2014 per H&P per Dr Wynonia Lawman   . Cancer ([place])   . GERD (gastroesophageal reflux disease)   . Heart murmur   . Hyperlipidemia    per H&P per Dr Wynonia Lawman 11/11/2014  . Hypertension   . Mitral valve regurgitation    per H&P per Dr Wynonia Lawman 11/11/2014  .  Vertigo   . Wears glasses     Past Surgical History:  Procedure Laterality Date  . cyst removed      pt not sure which toe or on which foot [phone]  . LYMPH NODE DISSECTION Bilateral 06/30/2015   Procedure: BILATERAL PELVIC LYMPH NODE DISSECTION;  Surgeon: Ardis Hughs, MD;  Location: [facility];  Service: Urology;  Laterality: Bilateral;  . ROBOT ASSISTED LAPAROSCOPIC RADICAL PROSTATECTOMY N/A 06/30/2015   Procedure: ROBOTIC ASSISTED LAPAROSCOPIC RADICAL PROSTATECTOMY;  Surgeon: Ardis Hughs, MD;  Location: [facility];  Service: Urology;  Laterality: N/A;    Current Medications: Current Meds  Medication Sig  . amLODipine (NORVASC) 10 MG tablet Take 1 tablet (10 mg total) by mouth daily.  . chlorthalidone (HYGROTON) 25 MG tablet TAKE 1/2 TABLET DAILY  . Melatonin 1 MG CAPS 1 capsule at bedtime as needed  . Omega 3 1000 MG CAPS 1 capsule  . omeprazole (PRILOSEC OTC) 20 MG tablet 1 tablet 30 minutes before morning meal     Allergies:   Lisinopril and Losartan   Social History   Socioeconomic History  . Marital status: Widowed    Spouse name: Not on file  . Number of children: Not on file  . Years of education: Not on file  . Highest education level: Not on file  Occupational History  . Not on file  Tobacco Use  . Smoking status: Never  Smoker  . Smokeless tobacco: Never Used  Substance and Sexual Activity  . Alcohol use: Yes    Comment: several glasses of wine on weekend  . Drug use: No  . Sexual activity: Not on file  Other Topics Concern  . Not on file  Social History Narrative  . Not on file   Social Determinants of Health   Financial Resource Strain: Not on file  Food Insecurity: Not on file  Transportation Needs: Not on file  Physical Activity: Not on file  Stress: Not on file  Social Connections: Not on file     Family History: [name] family history is not on file.  ROS:   Please see the history of present illness.     All other systems  reviewed and are negative.  EKGs/Labs/Other Studies Reviewed:    The following studies were reviewed today:   EKG:  EKG is not ordered today.  The ekg ordered at prior clinic visit demonstrates normal sinus rhythm, rate 82, no ST/T abnormalities  Recent Labs: 12/27/2019: BUN 19; Creatinine, Ser 1.25; Potassium 5.2; Sodium 136  Recent Lipid Panel No results found for: CHOL, TRIG, HDL, CHOLHDL, VLDL, LDLCALC, LDLDIRECT  Physical Exam:    VS:  BP 130/88   Pulse 83   Ht 5\' 10"  (1.778 m)   Wt 182 lb (82.6 kg)   SpO2 98%   BMI 26.11 kg/m     Wt Readings from Last 3 Encounters:  09/11/20 182 lb (82.6 kg)  03/02/20 175 lb 12.8 oz (79.7 kg)  12/03/19 174 lb 12.8 oz (79.3 kg)     GEN: Well nourished, well developed in no acute distress HEENT: Normal NECK: No JVD; No carotid bruits LYMPHATICS: No lymphadenopathy CARDIAC: RRR, no murmurs, rubs, gallops RESPIRATORY:  Clear to auscultation without rales, wheezing or rhonchi  ABDOMEN: Soft, non-tender, non-distended MUSCULOSKELETAL:  No edema; No deformity  SKIN: Warm and dry NEUROLOGIC:  Alert and oriented x 3 PSYCHIATRIC:  Normal affect   ASSESSMENT:    1. Hyperlipidemia, unspecified hyperlipidemia type   2. Aortic dilatation (HCC)   3. Essential hypertension   4. Aortic valve insufficiency, etiology of cardiac valve disease unspecified   5. Mitral valve insufficiency, unspecified etiology    PLAN:     Valvular heart disease: Mild AI, mild MR, mild PS on TTE 11/01/2019.  Will monitor, plan repeat echocardiogram in 2 years  Aortic dilatation: Ascending aorta measured 41 mm on echocardiogram 11/01/2019.  CTA chest on 12/16/2019 showed max diameter of ascending aorta measured 37 mm  Hypertension: Developed hyperkalemia (K 5.9) on losartan, was switched to amlodipine.  Currently on amlodipine 10 mg daily.  BP remains above goal less than 130/80, will add chlorthalidone 12.5 mg daily.  Check BMP in 1 to 2 weeks.  Asked patient to  check BP daily for next 2 weeks and call with results.  Hyperlipidemia: LDL 148 on 10/07/2019.  10-year ASCVD risk score is 14%.  Switch from pravastatin 20 mg to rosuvastatin 10 mg daily in June 2021.  LDL 109 on 01/07/2020.  Calcium score on 03/21/2020 was 192 (73rd percentile).  Switched to rosuvastatin 20 mg daily.  Will recheck lipid panel.  Lung nodule: Small lung nodules on recent CT.  Plan CT chest without contrast in 1 year to follow.  RTC in 6 months   Medication Adjustments/Labs and Tests Ordered: Current medicines are reviewed at length with the patient today.  Concerns regarding medicines are outlined above.  Orders Placed This Encounter  Procedures  . Basic metabolic panel  . Lipid panel  . Magnesium  . EKG 12-Lead   Meds ordered this encounter  Medications  . chlorthalidone (HYGROTON) 25 MG tablet    Sig: TAKE 1/2 TABLET DAILY    Dispense:  45 tablet    Refill:  1    There are no Patient Instructions on file for this visit.   Signed, Donato Heinz, MD  09/11/2020 8:58 AM    [place]

## 2020-09-11 ENCOUNTER — Other Ambulatory Visit: Payer: Self-pay

## 2020-09-11 ENCOUNTER — Encounter: Payer: Self-pay | Admitting: Cardiology

## 2020-09-11 ENCOUNTER — Ambulatory Visit: Payer: 59 | Admitting: Cardiology

## 2020-09-11 VITALS — BP 130/88 | HR 83 | Ht 70.0 in | Wt 182.0 lb

## 2020-09-11 DIAGNOSIS — I351 Nonrheumatic aortic (valve) insufficiency: Secondary | ICD-10-CM | POA: Diagnosis not present

## 2020-09-11 DIAGNOSIS — I10 Essential (primary) hypertension: Secondary | ICD-10-CM

## 2020-09-11 DIAGNOSIS — I340 Nonrheumatic mitral (valve) insufficiency: Secondary | ICD-10-CM

## 2020-09-11 DIAGNOSIS — I77819 Aortic ectasia, unspecified site: Secondary | ICD-10-CM | POA: Diagnosis not present

## 2020-09-11 DIAGNOSIS — E785 Hyperlipidemia, unspecified: Secondary | ICD-10-CM | POA: Diagnosis not present

## 2020-09-11 MED ORDER — CHLORTHALIDONE 25 MG PO TABS: ORAL_TABLET | ORAL | 1 refills | Status: DC

## 2020-09-11 NOTE — Progress Notes (Signed)
Cardiology Office Note:    Date:  09/11/2020   ID:  Steven Hanson, DOB Oct 28, 1955, MRN [id number]  PCP:  Kristen Loader, FNP  Cardiologist:  No primary care provider on file.  Electrophysiologist:  None   Referring MD: Leighton Ruff, MD   Chief Complaint  Patient presents with  . Hypertension    History of Present Illness:     Steven Hanson is a 65 y.o. male with a hx of mitral regurgitation, aortic regurgitation, hyperlipidemia, hypertension, prostate cancer who presents for follow-up.  He was referred by Dr. Drema Dallas for evaluation of valvular heart disease, initially seen on 12/03/2019.  He reports that he is very active, runs 4 to 5 miles 5 days/week.  He denies any chest pain, dyspnea, lightheadedness, syncope, palpitations, or lower extremity edema.  Reports he checks his BP about 5 times per week, has typically been in the 130s over 80s, rarely above 140/98.  No smoking history.  No history of heart disease in his immediate family.  Echocardiogram on 11/01/2019 showed LVEF 60 to 65%, redundant mitral valve cords, mild MR, mild AI, mild PS, ascending aorta dilatation measuring 41 mm.  CTA chest on 12/16/2019 showed max diameter of ascending aorta measured 37 mm, also with small pulmonary nodules.  Calcium score on 03/21/2020 was 192 (73rd percentile).  Since last clinic visit, he is doing well. His average at home BP has been 120-140s/70-80s. He has no chest pain, palpitations, SOB, or LE edema. He has no PND or  Orthopnea. He runs 15-18 miles per week, and notes no exertional chest pain or SOB during his exercises.    Past Medical History:  Diagnosis Date  . Aortic valve insufficiency    11/11/2014 per H&P per Dr Wynonia Lawman   . Cancer ([place])   . GERD (gastroesophageal reflux disease)   . Heart murmur   . Hyperlipidemia    per H&P per Dr Wynonia Lawman 11/11/2014  . Hypertension   . Mitral valve regurgitation    per H&P per Dr Wynonia Lawman 11/11/2014  . Vertigo   . Wears glasses     Past Surgical  History:  Procedure Laterality Date  . cyst removed      pt not sure which toe or on which foot [phone]  . LYMPH NODE DISSECTION Bilateral 06/30/2015   Procedure: BILATERAL PELVIC LYMPH NODE DISSECTION;  Surgeon: Ardis Hughs, MD;  Location: [facility];  Service: Urology;  Laterality: Bilateral;  . ROBOT ASSISTED LAPAROSCOPIC RADICAL PROSTATECTOMY N/A 06/30/2015   Procedure: ROBOTIC ASSISTED LAPAROSCOPIC RADICAL PROSTATECTOMY;  Surgeon: Ardis Hughs, MD;  Location: [facility];  Service: Urology;  Laterality: N/A;    Current Medications: Current Meds  Medication Sig  . amLODipine (NORVASC) 10 MG tablet Take 1 tablet (10 mg total) by mouth daily.  . chlorthalidone (HYGROTON) 25 MG tablet TAKE 1/2 TABLET DAILY  . Melatonin 1 MG CAPS 1 capsule at bedtime as needed  . Omega 3 1000 MG CAPS 1 capsule  . omeprazole (PRILOSEC OTC) 20 MG tablet 1 tablet 30 minutes before morning meal     Allergies:   Lisinopril and Losartan   Social History   Socioeconomic History  . Marital status: Widowed    Spouse name: Not on file  . Number of children: Not on file  . Years of education: Not on file  . Highest education level: Not on file  Occupational History  . Not on file  Tobacco Use  . Smoking status: Never Smoker  . Smokeless tobacco:  Never Used  Substance and Sexual Activity  . Alcohol use: Yes    Comment: several glasses of wine on weekend  . Drug use: No  . Sexual activity: Not on file  Other Topics Concern  . Not on file  Social History Narrative  . Not on file   Social Determinants of Health   Financial Resource Strain: Not on file  Food Insecurity: Not on file  Transportation Needs: Not on file  Physical Activity: Not on file  Stress: Not on file  Social Connections: Not on file     Family History: [name] family history is not on file.  ROS:   Please see the history of present illness.     All other systems reviewed and are negative.  EKGs/Labs/Other  Studies Reviewed:    The following studies were reviewed today:   EKG:   4/22- normal sinus rhythm, rate 83, no ST abnormalities 6/21- normal sinus rhythm, rate 82, no ST/T abnormalities  Recent Labs: 12/27/2019: BUN 19; Creatinine, Ser 1.25; Potassium 5.2; Sodium 136  Recent Lipid Panel No results found for: CHOL, TRIG, HDL, CHOLHDL, VLDL, LDLCALC, LDLDIRECT  Physical Exam:    VS:  BP 130/88   Pulse 83   Ht 5\' 10"  (1.778 m)   Wt 182 lb (82.6 kg)   SpO2 98%   BMI 26.11 kg/m     Wt Readings from Last 3 Encounters:  09/11/20 182 lb (82.6 kg)  03/02/20 175 lb 12.8 oz (79.7 kg)  12/03/19 174 lb 12.8 oz (79.3 kg)     GEN: Well nourished, well developed in no acute distress HEENT: Normal NECK: No JVD; No carotid bruits LYMPHATICS: No lymphadenopathy CARDIAC: RRR, no murmurs, rubs, gallops RESPIRATORY:  Clear to auscultation without rales, wheezing or rhonchi  ABDOMEN: Soft, non-tender, non-distended MUSCULOSKELETAL:  No edema; No deformity  SKIN: Warm and dry NEUROLOGIC:  Alert and oriented x 3 PSYCHIATRIC:  Normal affect   ASSESSMENT:    1. Essential hypertension   2. Aortic dilatation (HCC)   3. Aortic valve insufficiency, etiology of cardiac valve disease unspecified   4. Mitral valve insufficiency, unspecified etiology   5. Hyperlipidemia, unspecified hyperlipidemia type    PLAN:     Valvular heart disease: Mild AI, mild MR, mild PS on TTE 11/01/2019.  Will monitor, plan repeat echocardiogram in 2 years  Aortic dilatation: Ascending aorta measured 41 mm on echocardiogram 11/01/2019.  CTA chest on 12/16/2019 showed max diameter of ascending aorta measured 37 mm  Hypertension: Developed hyperkalemia (K 5.9) on losartan, was switched to amlodipine.  Currently on amlodipine 10 mg daily.  BP remains above goal less than 130/80, will add chlorthalidone 12.5 mg daily.  Check BMP in 1 to 2 weeks.  Asked patient to check BP daily for next 2 weeks and call with  results.  Hyperlipidemia: LDL 148 on 10/07/2019.  10-year ASCVD risk score is 14%.  Switch from pravastatin 20 mg to rosuvastatin 10 mg daily in June 2021.  LDL 109 on 01/07/2020.  Calcium score on 03/21/2020 was 192 (73rd percentile).  Switched to rosuvastatin 20 mg daily.  Will recheck lipid panel.  Lung nodule: Small lung nodules on recent CT.  Plan CT chest without contrast in 1 year to follow.  RTC in 6 months    Medication Adjustments/Labs and Tests Ordered: Current medicines are reviewed at length with the patient today.  Concerns regarding medicines are outlined above.  Orders Placed This Encounter  Procedures  . Basic metabolic panel  .  Lipid panel  . Magnesium  . EKG 12-Lead   Meds ordered this encounter  Medications  . chlorthalidone (HYGROTON) 25 MG tablet    Sig: TAKE 1/2 TABLET DAILY    Dispense:  45 tablet    Refill:  1    Patient Instructions  Medication Instructions:  START CHLORTHALIDONE 25 MG 1/2 TABLET DAILY   *If you need a refill on your cardiac medications before your next appointment, please call your pharmacy*  Lab Work: FASTING BMET/LP/MAGNESIUM IN 1 TO 2 WEEKS  If you have labs (blood work) drawn today and your tests are completely normal, you will receive your results only by: Marland Kitchen MyChart Message (if you have MyChart) OR . A paper copy in the mail If you have any lab test that is abnormal or we need to change your treatment, we will call you to review the results.  Testing/Procedures: NONE  Follow-Up: At [facility], you and your health needs are our priority.  As part of our continuing mission to provide you with exceptional heart care, we have created designated Provider Care Teams.  These Care Teams include your primary Cardiologist (physician) and Advanced Practice Providers (APPs -  Physician Assistants and Nurse Practitioners) who all work together to provide you with the care you need, when you need it.  We recommend signing up for the  patient portal called "MyChart".  Sign up information is provided on this After Visit Summary.  MyChart is used to connect with patients for Virtual Visits (Telemedicine).  Patients are able to view lab/test results, encounter notes, upcoming appointments, etc.  Non-urgent messages can be sent to your provider as well.   To learn more about what you can do with MyChart, go to [email or web address].    Your next appointment:   6 month(s)  The format for your next appointment:   In Person  Provider:   You may see DR Gardiner Rhyme or one of the following Advanced Practice Providers on your designated Care Team:    Rosaria Ferries, PA-C  Jory Sims, DNP, ANP  Other Instructions MONITOR YOUR BLOOD PRESSURE DAILY FOR 2 WEEKS, CALL THE OFFICE WITH READINGS     I,Alexis Bryant,acting as a scribe for Donato Heinz, MD.,have documented all relevant documentation on the behalf of Donato Heinz, MD,as directed by  Donato Heinz, MD while in the presence of Donato Heinz, MD.  Signed, Donato Heinz, MD  09/11/2020 9:00 AM    [place]

## 2020-09-11 NOTE — Patient Instructions (Signed)
Medication Instructions:  START CHLORTHALIDONE 25 MG 1/2 TABLET DAILY   *If you need a refill on your cardiac medications before your next appointment, please call your pharmacy*  Lab Work: FASTING BMET/LP/MAGNESIUM IN 1 TO 2 WEEKS  If you have labs (blood work) drawn today and your tests are completely normal, you will receive your results only by: Marland Kitchen MyChart Message (if you have MyChart) OR . A paper copy in the mail If you have any lab test that is abnormal or we need to change your treatment, we will call you to review the results.  Testing/Procedures: NONE  Follow-Up: At [facility], you and your health needs are our priority.  As part of our continuing mission to provide you with exceptional heart care, we have created designated Provider Care Teams.  These Care Teams include your primary Cardiologist (physician) and Advanced Practice Providers (APPs -  Physician Assistants and Nurse Practitioners) who all work together to provide you with the care you need, when you need it.  We recommend signing up for the patient portal called "MyChart".  Sign up information is provided on this After Visit Summary.  MyChart is used to connect with patients for Virtual Visits (Telemedicine).  Patients are able to view lab/test results, encounter notes, upcoming appointments, etc.  Non-urgent messages can be sent to your provider as well.   To learn more about what you can do with MyChart, go to [email or web address].    Your next appointment:   6 month(s)  The format for your next appointment:   In Person  Provider:   You may see DR Gardiner Rhyme or one of the following Advanced Practice Providers on your designated Care Team:    Rosaria Ferries, PA-C  Jory Sims, DNP, ANP  Other Instructions MONITOR YOUR BLOOD PRESSURE DAILY FOR 2 WEEKS, CALL THE OFFICE WITH READINGS

## 2020-09-26 LAB — BASIC METABOLIC PANEL
BUN/Creatinine Ratio: 14 (ref 10–24)
BUN: 19 mg/dL (ref 8–27)
CO2: 25 mmol/L (ref 20–29)
Calcium: 9.9 mg/dL (ref 8.6–10.2)
Chloride: 97 mmol/L (ref 96–106)
Creatinine, Ser: 1.38 mg/dL — ABNORMAL HIGH (ref 0.76–1.27)
Glucose: 113 mg/dL — ABNORMAL HIGH (ref 65–99)
Potassium: 4.5 mmol/L (ref 3.5–5.2)
Sodium: 137 mmol/L (ref 134–144)
eGFR: 58 mL/min/{1.73_m2} — ABNORMAL LOW (ref >59)

## 2020-09-26 LAB — LIPID PANEL
Chol/HDL Ratio: 3.6 ratio (ref 0.0–5.0)
Cholesterol, Total: 190 mg/dL (ref 100–199)
HDL: 53 mg/dL (ref >39)
LDL Chol Calc (NIH): 113 mg/dL — ABNORMAL HIGH (ref 0–99)
Triglycerides: 134 mg/dL (ref 0–149)
VLDL Cholesterol Cal: 24 mg/dL (ref 5–40)

## 2020-09-26 LAB — MAGNESIUM: Magnesium: 1.9 mg/dL (ref 1.6–2.3)

## 2020-09-26 NOTE — Telephone Encounter (Signed)
BP elevated, will likely need to increase chlorthalidone to 25 mg daily but will first follow-up results of blood work today

## 2020-09-27 ENCOUNTER — Other Ambulatory Visit: Payer: Self-pay | Admitting: *Deleted

## 2020-09-27 DIAGNOSIS — Z79899 Other long term (current) drug therapy: Secondary | ICD-10-CM

## 2020-09-27 DIAGNOSIS — I10 Essential (primary) hypertension: Secondary | ICD-10-CM

## 2020-09-27 MED ORDER — CHLORTHALIDONE 25 MG PO TABS: 25.0000 mg | ORAL_TABLET | Freq: Every day | ORAL | 3 refills | Status: DC

## 2020-09-27 MED ORDER — ROSUVASTATIN CALCIUM 40 MG PO TABS: 40.0000 mg | ORAL_TABLET | Freq: Every day | ORAL | 3 refills | Status: DC

## 2020-09-28 MED ORDER — CHLORTHALIDONE 25 MG PO TABS: 25.0000 mg | ORAL_TABLET | Freq: Every day | ORAL | 3 refills | Status: DC

## 2020-09-28 MED ORDER — ROSUVASTATIN CALCIUM 40 MG PO TABS: 40.0000 mg | ORAL_TABLET | Freq: Every day | ORAL | 3 refills | Status: DC

## 2020-09-28 NOTE — Addendum Note (Signed)
Addended by: Patria Mane A on: 09/28/2020 01:00 PM   Modules accepted: Orders

## 2020-10-06 NOTE — Telephone Encounter (Signed)
Would give the increased chlorthalidone dose more time before making any changes, recommend checking BMET next week and continue to check BP twice daily.  Can we schedule him in pharmacy HTN clinic?

## 2020-10-18 NOTE — Telephone Encounter (Signed)
I spoke with patient, I think there was a misunderstanding.  Plan had been to start chlorthalidone in addition to continuing amlodipine, but he had stopped his amlodipine when he started chlorthalidone.  Recommended restarting amlodipine.

## 2020-10-26 NOTE — Telephone Encounter (Signed)
BP looks better, would continue current meds

## 2020-11-02 ENCOUNTER — Ambulatory Visit (INDEPENDENT_AMBULATORY_CARE_PROVIDER_SITE_OTHER): Payer: 59 | Admitting: Pharmacist Clinician (PhC)/ Clinical Pharmacy Specialist

## 2020-11-02 ENCOUNTER — Other Ambulatory Visit: Payer: Self-pay

## 2020-11-02 DIAGNOSIS — Z79899 Other long term (current) drug therapy: Secondary | ICD-10-CM | POA: Diagnosis not present

## 2020-11-02 DIAGNOSIS — I10 Essential (primary) hypertension: Secondary | ICD-10-CM | POA: Diagnosis not present

## 2020-11-02 LAB — BASIC METABOLIC PANEL
BUN/Creatinine Ratio: 18 (ref 10–24)
BUN: 26 mg/dL (ref 8–27)
CO2: 24 mmol/L (ref 20–29)
Calcium: 10 mg/dL (ref 8.6–10.2)
Chloride: 94 mmol/L — ABNORMAL LOW (ref 96–106)
Creatinine, Ser: 1.45 mg/dL — ABNORMAL HIGH (ref 0.76–1.27)
Glucose: 74 mg/dL (ref 65–99)
Potassium: 4 mmol/L (ref 3.5–5.2)
Sodium: 137 mmol/L (ref 134–144)
eGFR: 54 mL/min/{1.73_m2} — ABNORMAL LOW (ref >59)

## 2020-11-02 LAB — MAGNESIUM: Magnesium: 2.3 mg/dL (ref 1.6–2.3)

## 2020-11-02 NOTE — Patient Instructions (Signed)
Continue to monitor your BP at home at lease 3-4 days each week.  In about a month, send me a message thru MyChart to let me know what your readings and averages are.    Go to the lab today to check kidney function  Take your BP meds as follows:  Switch one of your medications to mornings, continue the other at night.    If you have any questions or concerns, please reach out to me Tommy Medal thru MyChart  Bring all of your meds, your BP cuff and your record of home blood pressures to your next appointment.  Exercise as you're able, try to walk approximately 30 minutes per day.  Keep salt intake to a minimum, especially watch canned and prepared boxed foods.  Eat more fresh fruits and vegetables and fewer canned items.  Avoid eating in fast food restaurants.    HOW TO TAKE YOUR BLOOD PRESSURE: . Rest 5 minutes before taking your blood pressure. .  Don't smoke or drink caffeinated beverages for at least 30 minutes before. . Take your blood pressure before (not after) you eat. . Sit comfortably with your back supported and both feet on the floor (don't cross your legs). . Elevate your arm to heart level on a table or a desk. . Use the proper sized cuff. It should fit smoothly and snugly around your bare upper arm. There should be enough room to slip a fingertip under the cuff. The bottom edge of the cuff should be 1 inch above the crease of the elbow. . Ideally, take 3 measurements at one sitting and record the average.

## 2020-11-02 NOTE — Progress Notes (Signed)
11/02/2020 Steven Hanson 1956/01/06 [id number]   HPI:  Steven Hanson is a 65 y.o. male patient of Dr Gardiner Rhyme, with a PMH below who presents today for hypertension clinic evaluation.  He was seen by Dr. Gardiner Rhyme last month, at which time his pressure was noted to be 130/88.  He was started on chlorthalidone 12.5 mg daily, in addition to his 10 mg amlodipine.  Shortly after that appointment patient reported in that home BP readings were still elevated and chlorthalidone was increased to 25 mg daily.    Today he returns for follow up.  No complaints about his medications, takes both amlodipine and chlorthalidone at bedtime.  No chest pain, shortness of breath, dizziness or lower extremity edema.  He does admit to some confusion when he started the chlorthalidone, he stopped amlodipine.  After a few My Chart conversations with Dr. Gardiner Rhyme he realized the error and started back on amlodipine.  Since then his blood pressures have improved and are just about to goal.  In the past he developed hyperkalemia with losartan, but he wonders if it also was affected by him eating more spinach, bananas and other potassium rich foods during that time.    Past Medical History: Valvular heart disease Mild AI, mild MR, mild PS  ASCVD Coronary calcium score 192 (73rd percentile)  hyperlipidemia LDL 113 on rosuvastatin 20, increased to 40 mg (4/22)  GERD On omeprazole daily     Blood Pressure Goal:  130/80  Current Medications: chlorthalidone 25 mg qd, amlodipine 10 mg qd (both at night)  Family Hx: mother died from stroke, father from aneurysm; siblings and nieces/nephews many with hypertension; kids [phone]) - without hypertension  Social Hx: some wine on weekends, no tobacco; iced tea (sweet) with dinner, no toher caffeine  Diet: mostly home cooked meals, very little added salt;  Standard dinner is a meat, veggie and potato; veggies fresh in summer otherwise canned or frozen  Exercise:  runs 18 miles per week on average, most days gets 10,000-21,000 steps  Home BP readings:   20 readings from April 21 to May 10 show AM average 135/87 and PM average 133/87.    15 readings from May 12 to May 26 show AM average 127/81 and PM average 131/83  Intolerances: lisinopril - cough, losartan - hyperkalemia  Labs: 09/26/20:  Na 137, K 4.5, Glu 113, BUN 19, SCr 1.38 GFR 58 (on chlorthalidone 12.5)   Wt Readings from Last 3 Encounters:  11/02/20 182 lb (82.6 kg)  09/11/20 182 lb (82.6 kg)  03/02/20 175 lb 12.8 oz (79.7 kg)   BP Readings from Last 3 Encounters:  11/02/20 128/90  09/11/20 130/88  03/02/20 (!) 150/98   Pulse Readings from Last 3 Encounters:  11/02/20 78  09/11/20 83  03/02/20 80    Current Outpatient Medications  Medication Sig Dispense Refill  . amLODipine (NORVASC) 10 MG tablet Take 1 tablet (10 mg total) by mouth daily. 90 tablet 3  . chlorthalidone (HYGROTON) 25 MG tablet Take 1 tablet (25 mg total) by mouth daily. 90 tablet 3  . latanoprost (XALATAN) 0.005 % ophthalmic solution 1 drop at bedtime.    . Melatonin 1 MG CAPS 1 capsule at bedtime as needed    . Omega 3 1000 MG CAPS 1 capsule    . omeprazole (PRILOSEC OTC) 20 MG tablet 1 tablet 30 minutes before morning meal    . rosuvastatin (CRESTOR) 40 MG tablet Take 1 tablet (40 mg total) by mouth daily.  90 tablet 3   No current facility-administered medications for this visit.    Allergies  Allergen Reactions  . Lisinopril     Other reaction(s): cough  . Losartan     Other reaction(s): elevated potassium    Past Medical History:  Diagnosis Date  . Aortic valve insufficiency    11/11/2014 per H&P per Dr Wynonia Lawman   . Cancer ([place])   . GERD (gastroesophageal reflux disease)   . Heart murmur   . Hyperlipidemia    per H&P per Dr Wynonia Lawman 11/11/2014  . Hypertension   . Mitral valve regurgitation    per H&P per Dr Wynonia Lawman 11/11/2014  . Vertigo   . Wears glasses     Blood pressure 128/90, pulse 78,  resp. rate 16, height 5\' 10"  (1.778 m), weight 182 lb (82.6 kg), SpO2 97 %.  Hypertension Patient with essential hypertension (systolic and diastolic), doing better with combination of amlodipine 10 mg and chlorthalidone 25 mg.  Will repeat labs to day to check renal function, now 2 weeks after increase in chlorthalidone dose.  He is not quite to diastolic goal, so will have him try dividing medications, and taking one in the mornings, the other at night.  He should continue with home monitoring and was asked to send a MyChart message in about a month with his home readings.   If he is not at goal at that time, will consider adding in a low dose of ARB (but watch potassium closely).  Patient agreeable with plan, all questions answered.     Tommy Medal PharmD CPP [facility] [address] [place], [place] [address] [phone]

## 2020-11-02 NOTE — Assessment & Plan Note (Signed)
Patient with essential hypertension (systolic and diastolic), doing better with combination of amlodipine 10 mg and chlorthalidone 25 mg.  Will repeat labs to day to check renal function, now 2 weeks after increase in chlorthalidone dose.  He is not quite to diastolic goal, so will have him try dividing medications, and taking one in the mornings, the other at night.  He should continue with home monitoring and was asked to send a MyChart message in about a month with his home readings.   If he is not at goal at that time, will consider adding in a low dose of ARB (but watch potassium closely).  Patient agreeable with plan, all questions answered.

## 2020-11-07 ENCOUNTER — Other Ambulatory Visit (HOSPITAL_COMMUNITY): Payer: Self-pay | Admitting: Gastroenterology

## 2020-11-07 ENCOUNTER — Other Ambulatory Visit: Payer: Self-pay | Admitting: Gastroenterology

## 2020-11-07 DIAGNOSIS — R109 Unspecified abdominal pain: Secondary | ICD-10-CM

## 2020-11-13 ENCOUNTER — Other Ambulatory Visit: Payer: Self-pay

## 2020-11-13 ENCOUNTER — Ambulatory Visit (HOSPITAL_COMMUNITY)
Admission: RE | Admit: 2020-11-13 | Discharge: 2020-11-13 | Disposition: A | Discharge status: Home / Self Care | Payer: 59 | Source: Ambulatory Visit | Attending: Gastroenterology | Admitting: Gastroenterology

## 2020-11-13 DIAGNOSIS — R109 Unspecified abdominal pain: Secondary | ICD-10-CM | POA: Insufficient documentation

## 2020-11-28 MED ORDER — VALSARTAN 80 MG PO TABS: 80.0000 mg | ORAL_TABLET | Freq: Every day | ORAL | 1 refills | Status: DC

## 2020-11-30 ENCOUNTER — Other Ambulatory Visit: Payer: Self-pay | Admitting: Pharmacist Clinician (PhC)/ Clinical Pharmacy Specialist

## 2020-11-30 DIAGNOSIS — I10 Essential (primary) hypertension: Secondary | ICD-10-CM

## 2020-11-30 NOTE — Progress Notes (Signed)
Labs ordered.

## 2020-12-19 ENCOUNTER — Telehealth: Payer: Self-pay | Admitting: Cardiology

## 2020-12-19 DIAGNOSIS — R911 Solitary pulmonary nodule: Secondary | ICD-10-CM

## 2020-12-19 DIAGNOSIS — IMO0001 Reserved for inherently not codable concepts without codable children: Secondary | ICD-10-CM

## 2020-12-19 NOTE — Telephone Encounter (Signed)
Pt has an order in for a CT that ends today, Baxter Flattery from [facility] is calling to get an extension on this [place] CT [phone]

## 2020-12-19 NOTE — Telephone Encounter (Signed)
Per Dr. Newman Nickels nurse, pt is due for repeat Chest CT.  New order placed Baxter Flattery made aware

## 2020-12-21 LAB — BASIC METABOLIC PANEL
BUN/Creatinine Ratio: 17 (ref 10–24)
BUN: 27 mg/dL (ref 8–27)
CO2: 23 mmol/L (ref 20–29)
Calcium: 10 mg/dL (ref 8.6–10.2)
Chloride: 95 mmol/L — ABNORMAL LOW (ref 96–106)
Creatinine, Ser: 1.58 mg/dL — ABNORMAL HIGH (ref 0.76–1.27)
Glucose: 118 mg/dL — ABNORMAL HIGH (ref 65–99)
Potassium: 4.9 mmol/L (ref 3.5–5.2)
Sodium: 138 mmol/L (ref 134–144)
eGFR: 49 mL/min/{1.73_m2} — ABNORMAL LOW (ref >59)

## 2021-01-02 ENCOUNTER — Other Ambulatory Visit: Payer: Self-pay

## 2021-01-02 ENCOUNTER — Ambulatory Visit (INDEPENDENT_AMBULATORY_CARE_PROVIDER_SITE_OTHER)
Admission: RE | Admit: 2021-01-02 | Discharge: 2021-01-02 | Disposition: A | Discharge status: Home / Self Care | Payer: 59 | Source: Ambulatory Visit | Attending: Cardiology | Admitting: Cardiology

## 2021-01-02 DIAGNOSIS — R911 Solitary pulmonary nodule: Secondary | ICD-10-CM

## 2021-01-02 DIAGNOSIS — IMO0001 Reserved for inherently not codable concepts without codable children: Secondary | ICD-10-CM

## 2021-01-03 ENCOUNTER — Other Ambulatory Visit: Payer: Self-pay | Admitting: *Deleted

## 2021-01-03 DIAGNOSIS — R911 Solitary pulmonary nodule: Secondary | ICD-10-CM

## 2021-01-26 ENCOUNTER — Other Ambulatory Visit: Payer: Self-pay | Admitting: Cardiology

## 2021-01-29 MED ORDER — AMLODIPINE BESYLATE 5 MG PO TABS: 5.0000 mg | ORAL_TABLET | Freq: Every day | ORAL | 3 refills | Status: DC

## 2021-01-29 MED ORDER — VALSARTAN 80 MG PO TABS
80.0000 mg | ORAL_TABLET | Freq: Every day | ORAL | 3 refills | Status: DC
Start: 2021-01-29 — End: 2021-12-27

## 2021-04-22 NOTE — Progress Notes (Signed)
Cardiology Office Note:    Date:  04/26/2021   ID:  Steven Hanson, DOB Nov 28, 1955, MRN [id number]  PCP:  Kristen Loader, FNP  Cardiologist:  None  Electrophysiologist:  None   Referring MD: Kristen Loader, FNP   Chief Complaint  Patient presents with   Hypertension     History of Present Illness:     Steven Hanson is a 65 y.o. male with a hx of mitral regurgitation, aortic regurgitation, hyperlipidemia, hypertension, prostate cancer who presents for follow-up.  He was referred by Dr. Drema Dallas for evaluation of valvular heart disease, initially seen on 12/03/2019.    Echocardiogram on 11/01/2019 showed LVEF 60 to 65%, redundant mitral valve cords, mild MR, mild AI, mild PS, ascending aorta dilatation measuring 41 mm.  CTA chest on 12/16/2019 showed max diameter of ascending aorta measured 37 mm, also with small pulmonary nodules.  Calcium score on 03/21/2020 was 192 (73rd percentile).  Since last clinic visit, he reports that he has been doing well.  Denies any chest pain, dyspnea, lower extremity edema, or palpitations.  Reports rare lightheadedness, denies any syncope.  Home BP has been 100s to 130s over 70s to 80s.  Recently had stress fracture in foot, currently in boot.  Past Medical History:  Diagnosis Date   Aortic valve insufficiency    11/11/2014 per H&P per Dr Wynonia Lawman    Cancer [facility])    GERD (gastroesophageal reflux disease)    Heart murmur    Hyperlipidemia    per H&P per Dr Wynonia Lawman 11/11/2014   Hypertension    Mitral valve regurgitation    per H&P per Dr Wynonia Lawman 11/11/2014   Vertigo    Wears glasses     Past Surgical History:  Procedure Laterality Date   cyst removed      pt not sure which toe or on which foot 1988-1989   LYMPH NODE DISSECTION Bilateral 06/30/2015   Procedure: BILATERAL PELVIC LYMPH NODE DISSECTION;  Surgeon: Ardis Hughs, MD;  Location: [facility];  Service: Urology;  Laterality: Bilateral;   ROBOT ASSISTED LAPAROSCOPIC RADICAL PROSTATECTOMY N/A  06/30/2015   Procedure: ROBOTIC ASSISTED LAPAROSCOPIC RADICAL PROSTATECTOMY;  Surgeon: Ardis Hughs, MD;  Location: [facility];  Service: Urology;  Laterality: N/A;    Current Medications: Current Meds  Medication Sig   amLODipine (NORVASC) 5 MG tablet Take 1 tablet (5 mg total) by mouth daily.   chlorthalidone (HYGROTON) 25 MG tablet Take 1 tablet (25 mg total) by mouth daily.   latanoprost (XALATAN) 0.005 % ophthalmic solution 1 drop at bedtime.   Melatonin 1 MG CAPS 1 capsule at bedtime as needed   Omega 3 1000 MG CAPS 1 capsule   omeprazole (PRILOSEC OTC) 20 MG tablet 1 tablet 30 minutes before morning meal   rosuvastatin (CRESTOR) 40 MG tablet Take 1 tablet (40 mg total) by mouth daily.   valsartan (DIOVAN) 80 MG tablet Take 1 tablet (80 mg total) by mouth daily.     Allergies:   Lisinopril and Losartan   Social History   Socioeconomic History   Marital status: Widowed    Spouse name: Not on file   Number of children: Not on file   Years of education: Not on file   Highest education level: Not on file  Occupational History   Not on file  Tobacco Use   Smoking status: Never   Smokeless tobacco: Never  Substance and Sexual Activity   Alcohol use: Yes    Comment: several glasses of  wine on weekend   Drug use: No   Sexual activity: Not on file  Other Topics Concern   Not on file  Social History Narrative   Not on file   Social Determinants of Health   Financial Resource Strain: Not on file  Food Insecurity: Not on file  Transportation Needs: Not on file  Physical Activity: Not on file  Stress: Not on file  Social Connections: Not on file     Family History: [name] family history is not on file.  ROS:   Please see the history of present illness.     All other systems reviewed and are negative.  EKGs/Labs/Other Studies Reviewed:    The following studies were reviewed today:   EKG:   04/24/21-normal sinus rhythm, rate 79, no ST abnormalities, T  wave inversion in lead III 4/22- normal sinus rhythm, rate 83, no ST abnormalities 6/21- normal sinus rhythm, rate 82, no ST/T abnormalities  Recent Labs: 04/24/2021: BUN 22; Creatinine, Ser 1.37; Magnesium 1.9; Potassium 4.1; Sodium 139  Recent Lipid Panel    Component Value Date/Time   CHOL 150 04/24/2021 1653   TRIG 180 (H) 04/24/2021 1653   HDL 42 04/24/2021 1653   CHOLHDL 3.6 04/24/2021 1653   LDLCALC 77 04/24/2021 1653    Physical Exam:    VS:  BP 118/86   Pulse 79   Ht 5\' 10"  (1.778 m)   Wt 186 lb 9.6 oz (84.6 kg)   SpO2 97%   BMI 26.77 kg/m     Wt Readings from Last 3 Encounters:  04/24/21 186 lb 9.6 oz (84.6 kg)  11/02/20 182 lb (82.6 kg)  09/11/20 182 lb (82.6 kg)     GEN: Well nourished, well developed in no acute distress HEENT: Normal NECK: No JVD; No carotid bruits LYMPHATICS: No lymphadenopathy CARDIAC: RRR, no murmurs, rubs, gallops RESPIRATORY:  Clear to auscultation without rales, wheezing or rhonchi  ABDOMEN: Soft, non-tender, non-distended MUSCULOSKELETAL:  No edema; No deformity  SKIN: Warm and dry NEUROLOGIC:  Alert and oriented x 3 PSYCHIATRIC:  Normal affect   ASSESSMENT:    1. Essential hypertension   2. Hyperlipidemia, unspecified hyperlipidemia type   3. Lung nodule   4. Aortic dilatation (HCC)   5. Aortic valve insufficiency, etiology of cardiac valve disease unspecified   6. Mitral valve insufficiency, unspecified etiology     PLAN:     Valvular heart disease: Mild AI, mild MR, mild PS on TTE 11/01/2019.  Will monitor, plan repeat echocardiogram in 2 years (2023)  Aortic dilatation: Ascending aorta measured 41 mm on echocardiogram 11/01/2019.  CTA chest on 12/16/2019 showed max diameter of ascending aorta measured 37 mm  Hypertension: Developed hyperkalemia (K 5.9) on losartan, was switched to amlodipine.  Currently on amlodipine 5 mg daily and chlorthalidone 25 mg daily and valsartan 80 mg daily.  BP appears controlled.  Check  BMP, magnesium  Hyperlipidemia: LDL 148 on 10/07/2019.  10-year ASCVD risk score is 14%.  Switch from pravastatin 20 mg to rosuvastatin 10 mg daily in June 2021.  LDL 109 on 01/07/2020.  Calcium score on 03/21/2020 was 192 (73rd percentile).  Switched to rosuvastatin 20 mg daily.  LDL 113 on 09/26/2020, rosuvastatin increased to 40 mg daily.  We will repeat lipid panel  Lung nodule: Small lung nodules on r CT chest 12/2019.repeat CT chest 12/2020 showed lung nodule has resolved, suspect likely infectious etiology.  However was a new lung nodule seen, follow-up chest CT recommended in 1  year  RTC in 6 months    Medication Adjustments/Labs and Tests Ordered: Current medicines are reviewed at length with the patient today.  Concerns regarding medicines are outlined above.  Orders Placed This Encounter  Procedures   Basic metabolic panel   Magnesium   Lipid panel   EKG 12-Lead    No orders of the defined types were placed in this encounter.   Patient Instructions  Medication Instructions:  Your physician recommends that you continue on your current medications as directed. Please refer to the Current Medication list given to you today.  *If you need a refill on your cardiac medications before your next appointment, please call your pharmacy*   Lab Work: BMET, Mag, Lipid  If you have labs (blood work) drawn today and your tests are completely normal, you will receive your results only by: [place] (if you have MyChart) OR A paper copy in the mail If you have any lab test that is abnormal or we need to change your treatment, we will call you to review the results.  Follow-Up: At [facility], you and your health needs are our priority.  As part of our continuing mission to provide you with exceptional heart care, we have created designated Provider Care Teams.  These Care Teams include your primary Cardiologist (physician) and Advanced Practice Providers (APPs -  Physician  Assistants and Nurse Practitioners) who all work together to provide you with the care you need, when you need it.  We recommend signing up for the patient portal called "MyChart".  Sign up information is provided on this After Visit Summary.  MyChart is used to connect with patients for Virtual Visits (Telemedicine).  Patients are able to view lab/test results, encounter notes, upcoming appointments, etc.  Non-urgent messages can be sent to your provider as well.   To learn more about what you can do with MyChart, go to [email or web address].    Your next appointment:   6 month(s)  The format for your next appointment:   In Person  Provider:   Dr. Gardiner Rhyme   Signed, Donato Heinz, MD  04/26/2021 11:55 AM    [place]

## 2021-04-24 ENCOUNTER — Encounter: Payer: Self-pay | Admitting: Cardiology

## 2021-04-24 ENCOUNTER — Ambulatory Visit: Payer: 59 | Admitting: Cardiology

## 2021-04-24 ENCOUNTER — Other Ambulatory Visit: Payer: Self-pay

## 2021-04-24 VITALS — BP 118/86 | HR 79 | Ht 70.0 in | Wt 186.6 lb

## 2021-04-24 DIAGNOSIS — I351 Nonrheumatic aortic (valve) insufficiency: Secondary | ICD-10-CM

## 2021-04-24 DIAGNOSIS — R911 Solitary pulmonary nodule: Secondary | ICD-10-CM

## 2021-04-24 DIAGNOSIS — I10 Essential (primary) hypertension: Secondary | ICD-10-CM | POA: Diagnosis not present

## 2021-04-24 DIAGNOSIS — E785 Hyperlipidemia, unspecified: Secondary | ICD-10-CM

## 2021-04-24 DIAGNOSIS — I77819 Aortic ectasia, unspecified site: Secondary | ICD-10-CM

## 2021-04-24 DIAGNOSIS — I340 Nonrheumatic mitral (valve) insufficiency: Secondary | ICD-10-CM

## 2021-04-24 NOTE — Patient Instructions (Signed)
Medication Instructions:  Your physician recommends that you continue on your current medications as directed. Please refer to the Current Medication list given to you today.  *If you need a refill on your cardiac medications before your next appointment, please call your pharmacy*   Lab Work: BMET, Mag, Lipid  If you have labs (blood work) drawn today and your tests are completely normal, you will receive your results only by: [place] (if you have MyChart) OR A paper copy in the mail If you have any lab test that is abnormal or we need to change your treatment, we will call you to review the results.  Follow-Up: At [facility], you and your health needs are our priority.  As part of our continuing mission to provide you with exceptional heart care, we have created designated Provider Care Teams.  These Care Teams include your primary Cardiologist (physician) and Advanced Practice Providers (APPs -  Physician Assistants and Nurse Practitioners) who all work together to provide you with the care you need, when you need it.  We recommend signing up for the patient portal called "MyChart".  Sign up information is provided on this After Visit Summary.  MyChart is used to connect with patients for Virtual Visits (Telemedicine).  Patients are able to view lab/test results, encounter notes, upcoming appointments, etc.  Non-urgent messages can be sent to your provider as well.   To learn more about what you can do with MyChart, go to [email or web address].    Your next appointment:   6 month(s)  The format for your next appointment:   In Person  Provider:   Dr. Gardiner Rhyme

## 2021-04-25 LAB — LIPID PANEL
Chol/HDL Ratio: 3.6 ratio (ref 0.0–5.0)
Cholesterol, Total: 150 mg/dL (ref 100–199)
HDL: 42 mg/dL (ref >39)
LDL Chol Calc (NIH): 77 mg/dL (ref 0–99)
Triglycerides: 180 mg/dL — ABNORMAL HIGH (ref 0–149)
VLDL Cholesterol Cal: 31 mg/dL (ref 5–40)

## 2021-04-25 LAB — BASIC METABOLIC PANEL
BUN/Creatinine Ratio: 16 (ref 10–24)
BUN: 22 mg/dL (ref 8–27)
CO2: 26 mmol/L (ref 20–29)
Calcium: 9.5 mg/dL (ref 8.6–10.2)
Chloride: 98 mmol/L (ref 96–106)
Creatinine, Ser: 1.37 mg/dL — ABNORMAL HIGH (ref 0.76–1.27)
Glucose: 93 mg/dL (ref 70–99)
Potassium: 4.1 mmol/L (ref 3.5–5.2)
Sodium: 139 mmol/L (ref 134–144)
eGFR: 58 mL/min/{1.73_m2} — ABNORMAL LOW (ref >59)

## 2021-04-25 LAB — MAGNESIUM: Magnesium: 1.9 mg/dL (ref 1.6–2.3)

## 2021-04-30 ENCOUNTER — Other Ambulatory Visit: Payer: Self-pay | Admitting: *Deleted

## 2021-04-30 MED ORDER — EZETIMIBE 10 MG PO TABS: 10.0000 mg | ORAL_TABLET | Freq: Every day | ORAL | 3 refills | Status: DC

## 2021-07-26 ENCOUNTER — Encounter: Payer: Self-pay | Admitting: Cardiology

## 2021-07-27 ENCOUNTER — Other Ambulatory Visit: Payer: Self-pay

## 2021-07-27 MED ORDER — EZETIMIBE 10 MG PO TABS: 10.0000 mg | ORAL_TABLET | Freq: Every day | ORAL | 3 refills | Status: DC

## 2021-09-23 ENCOUNTER — Encounter: Payer: Self-pay | Admitting: Cardiology

## 2021-09-23 ENCOUNTER — Other Ambulatory Visit: Payer: Self-pay | Admitting: Cardiology

## 2021-09-24 MED ORDER — ROSUVASTATIN CALCIUM 40 MG PO TABS: 40.0000 mg | ORAL_TABLET | Freq: Every day | ORAL | 1 refills | Status: DC

## 2021-09-24 MED ORDER — CHLORTHALIDONE 25 MG PO TABS: 25.0000 mg | ORAL_TABLET | Freq: Every day | ORAL | 1 refills | Status: DC

## 2021-12-16 NOTE — Progress Notes (Signed)
Cardiology Office Note:    Date:  12/21/2021   ID:  Steven Hanson, DOB [birth year], MRN [id number]  PCP:  Kristen Loader, FNP  Cardiologist:  None  Electrophysiologist:  None   Referring MD: Kristen Loader, FNP   Chief Complaint  Patient presents with   Hypertension     History of Present Illness:     Steven Hanson is a 66 y.o. male with a hx of mitral regurgitation, aortic regurgitation, hyperlipidemia, hypertension, prostate cancer who presents for follow-up.  He was referred by Dr. Drema Dallas for evaluation of valvular heart disease, initially seen on 12/03/2019.    Echocardiogram on 11/01/2019 showed LVEF 60 to 65%, redundant mitral valve cords, mild MR, mild AI, mild PS, ascending aorta dilatation measuring 41 mm.  CTA chest on 12/16/2019 showed max diameter of ascending aorta measured 37 mm, also with small pulmonary nodules.  Calcium score on 03/21/2020 was 192 (73rd percentile).  Since last clinic visit, he reports that he is doing well.  Denies any chest pain, dyspnea, lower extremity edema, or palpitations.  Reports some lightheadedness but denies any syncope.  He walks 30 minutes/day on the treadmill in addition to walking his dog for a few miles each day.  Denies any exertional symptoms.  BP well controlled on home log, average 110s over 70s.  Past Medical History:  Diagnosis Date   Aortic valve insufficiency    11/11/2014 per H&P per Dr Wynonia Lawman    Cancer [facility])    GERD (gastroesophageal reflux disease)    Heart murmur    Hyperlipidemia    per H&P per Dr Wynonia Lawman 11/11/2014   Hypertension    Mitral valve regurgitation    per H&P per Dr Wynonia Lawman 11/11/2014   Vertigo    Wears glasses     Past Surgical History:  Procedure Laterality Date   cyst removed      pt not sure which toe or on which foot 1988-1989   LYMPH NODE DISSECTION Bilateral 06/30/2015   Procedure: BILATERAL PELVIC LYMPH NODE DISSECTION;  Surgeon: Ardis Hughs, MD;  Location: [facility];  Service: Urology;   Laterality: Bilateral;   ROBOT ASSISTED LAPAROSCOPIC RADICAL PROSTATECTOMY N/A 06/30/2015   Procedure: ROBOTIC ASSISTED LAPAROSCOPIC RADICAL PROSTATECTOMY;  Surgeon: Ardis Hughs, MD;  Location: [facility];  Service: Urology;  Laterality: N/A;    Current Medications: Current Meds  Medication Sig   amLODipine (NORVASC) 5 MG tablet Take 1 tablet (5 mg total) by mouth daily.   chlorthalidone (HYGROTON) 25 MG tablet Take 1 tablet (25 mg total) by mouth daily.   latanoprost (XALATAN) 0.005 % ophthalmic solution 1 drop at bedtime.   Melatonin 1 MG CAPS 1 capsule at bedtime as needed   Omega 3 1000 MG CAPS 1 capsule   omeprazole (PRILOSEC OTC) 20 MG tablet 1 tablet 30 minutes before morning meal   rosuvastatin (CRESTOR) 40 MG tablet Take 1 tablet (40 mg total) by mouth daily.   valsartan (DIOVAN) 80 MG tablet Take 1 tablet (80 mg total) by mouth daily.     Allergies:   Lisinopril and Losartan   Social History   Socioeconomic History   Marital status: Widowed    Spouse name: Not on file   Number of children: Not on file   Years of education: Not on file   Highest education level: Not on file  Occupational History   Not on file  Tobacco Use   Smoking status: Never   Smokeless tobacco: Never  Substance and  Sexual Activity   Alcohol use: Yes    Comment: several glasses of wine on weekend   Drug use: No   Sexual activity: Not on file  Other Topics Concern   Not on file  Social History Narrative   Not on file   Social Determinants of Health   Financial Resource Strain: Not on file  Food Insecurity: Not on file  Transportation Needs: Not on file  Physical Activity: Not on file  Stress: Not on file  Social Connections: Not on file     Family History: [name] family history is not on file.  ROS:   Please see the history of present illness.     All other systems reviewed and are negative.  EKGs/Labs/Other Studies Reviewed:    The following studies were reviewed  today:   EKG:   12/21/2021: Normal sinus rhythm, rate 71, left axis deviation, nonspecific intraventricular conduction delay, no ST abnormalities 04/24/21-normal sinus rhythm, rate 79, no ST abnormalities, T wave inversion in lead III 4/22- normal sinus rhythm, rate 83, no ST abnormalities 6/21- normal sinus rhythm, rate 82, no ST/T abnormalities  Recent Labs: 04/24/2021: BUN 22; Creatinine, Ser 1.37; Magnesium 1.9; Potassium 4.1; Sodium 139  Recent Lipid Panel    Component Value Date/Time   CHOL 150 04/24/2021 1653   TRIG 180 (H) 04/24/2021 1653   HDL 42 04/24/2021 1653   CHOLHDL 3.6 04/24/2021 1653   LDLCALC 77 04/24/2021 1653    Physical Exam:    VS:  BP 126/90 (BP Location: Left Arm)   Pulse 71   Ht '5\' 10"'$  (1.778 m)   Wt 188 lb 9.6 oz (85.5 kg)   SpO2 99%   BMI 27.06 kg/m     Wt Readings from Last 3 Encounters:  12/21/21 188 lb 9.6 oz (85.5 kg)  04/24/21 186 lb 9.6 oz (84.6 kg)  11/02/20 182 lb (82.6 kg)     GEN: Well nourished, well developed in no acute distress HEENT: Normal NECK: No JVD; No carotid bruits LYMPHATICS: No lymphadenopathy CARDIAC: RRR, no murmurs, rubs, gallops RESPIRATORY:  Clear to auscultation without rales, wheezing or rhonchi  ABDOMEN: Soft, non-tender, non-distended MUSCULOSKELETAL:  No edema; No deformity  SKIN: Warm and dry NEUROLOGIC:  Alert and oriented x 3 PSYCHIATRIC:  Normal affect   ASSESSMENT:    1. Aortic valve insufficiency, etiology of cardiac valve disease unspecified   2. Mitral valve insufficiency, unspecified etiology   3. Essential hypertension   4. Lung nodule   5. Hyperlipidemia, unspecified hyperlipidemia type      PLAN:     Valvular heart disease: Mild AI, mild MR, mild PS on TTE 11/01/2019.  Will check echocardiogram to monitor for progression of valvular disease  Aortic dilatation: Ascending aorta measured 41 mm on echocardiogram 11/01/2019.  CTA chest on 12/16/2019 showed max diameter of ascending aorta  measured 37 mm  Hypertension: Developed hyperkalemia (K 5.9) on losartan, was switched to amlodipine.  Currently on amlodipine 5 mg daily and chlorthalidone 25 mg daily and valsartan 80 mg daily.  BP appears controlled.    Hyperlipidemia: LDL 148 on 10/07/2019.  10-year ASCVD risk score is 14%.  Switch from pravastatin 20 mg to rosuvastatin 10 mg daily in June 2021.  LDL 109 on 01/07/2020.  Calcium score on 03/21/2020 was 192 (73rd percentile).  Switched to rosuvastatin 20 mg daily.  LDL 113 on 09/26/2020, rosuvastatin increased to 40 mg daily.  LDL 77 on 04/24/2021.  Zetia 10 mg daily added, LDL 45  on 10/30/21  Lung nodule: Small lung nodules on CT chest 12/2019.  Repeat CT chest 12/2020 showed lung nodule has resolved, suspect likely infectious etiology.  However was a new lung nodule seen, follow-up chest CT recommended in 1 year  RTC in 1 year    Medication Adjustments/Labs and Tests Ordered: Current medicines are reviewed at length with the patient today.  Concerns regarding medicines are outlined above.  Orders Placed This Encounter  Procedures   CT Chest Wo Contrast   EKG 12-Lead   ECHOCARDIOGRAM COMPLETE    No orders of the defined types were placed in this encounter.    Patient Instructions  Medication Instructions:  Your physician recommends that you continue on your current medications as directed. Please refer to the Current Medication list given to you today.  *If you need a refill on your cardiac medications before your next appointment, please call your pharmacy*  Testing/Procedures: Your physician has requested that you have an echocardiogram. Echocardiography is a painless test that uses sound waves to create images of your heart. It provides your doctor with information about the size and shape of your heart and how well your heart's chambers and valves are working. This procedure takes approximately one hour. There are no restrictions for this procedure.  CT chest w/o  contrast at [facility], they will call to schedule this.  Follow-Up: At [facility], you and your health needs are our priority.  As part of our continuing mission to provide you with exceptional heart care, we have created designated Provider Care Teams.  These Care Teams include your primary Cardiologist (physician) and Advanced Practice Providers (APPs -  Physician Assistants and Nurse Practitioners) who all work together to provide you with the care you need, when you need it.  We recommend signing up for the patient portal called "MyChart".  Sign up information is provided on this After Visit Summary.  MyChart is used to connect with patients for Virtual Visits (Telemedicine).  Patients are able to view lab/test results, encounter notes, upcoming appointments, etc.  Non-urgent messages can be sent to your provider as well.   To learn more about what you can do with MyChart, go to [email or web address].    Your next appointment:   12 month(s)  The format for your next appointment:   In Person  Provider:   Dr. Gardiner Rhyme  Important Information About Sugar         Signed, Donato Heinz, MD  12/21/2021 8:35 AM    [place]

## 2021-12-21 ENCOUNTER — Ambulatory Visit: Payer: 59 | Admitting: Cardiology

## 2021-12-21 ENCOUNTER — Encounter: Payer: Self-pay | Admitting: Cardiology

## 2021-12-21 VITALS — BP 126/90 | HR 71 | Ht 70.0 in | Wt 188.6 lb

## 2021-12-21 DIAGNOSIS — I351 Nonrheumatic aortic (valve) insufficiency: Secondary | ICD-10-CM

## 2021-12-21 DIAGNOSIS — I10 Essential (primary) hypertension: Secondary | ICD-10-CM | POA: Diagnosis not present

## 2021-12-21 DIAGNOSIS — R911 Solitary pulmonary nodule: Secondary | ICD-10-CM

## 2021-12-21 DIAGNOSIS — I340 Nonrheumatic mitral (valve) insufficiency: Secondary | ICD-10-CM | POA: Diagnosis not present

## 2021-12-21 DIAGNOSIS — E785 Hyperlipidemia, unspecified: Secondary | ICD-10-CM

## 2021-12-21 NOTE — Patient Instructions (Signed)
Medication Instructions:  Your physician recommends that you continue on your current medications as directed. Please refer to the Current Medication list given to you today.  *If you need a refill on your cardiac medications before your next appointment, please call your pharmacy*  Testing/Procedures: Your physician has requested that you have an echocardiogram. Echocardiography is a painless test that uses sound waves to create images of your heart. It provides your doctor with information about the size and shape of your heart and how well your heart's chambers and valves are working. This procedure takes approximately one hour. There are no restrictions for this procedure.  CT chest w/o contrast at [facility] [employer] [address], they will call to schedule this.  Follow-Up: At [facility], you and your health needs are our priority.  As part of our continuing mission to provide you with exceptional heart care, we have created designated Provider Care Teams.  These Care Teams include your primary Cardiologist (physician) and Advanced Practice Providers (APPs -  Physician Assistants and Nurse Practitioners) who all work together to provide you with the care you need, when you need it.  We recommend signing up for the patient portal called "MyChart".  Sign up information is provided on this After Visit Summary.  MyChart is used to connect with patients for Virtual Visits (Telemedicine).  Patients are able to view lab/test results, encounter notes, upcoming appointments, etc.  Non-urgent messages can be sent to your provider as well.   To learn more about what you can do with MyChart, go to [email or web address].    Your next appointment:   12 month(s)  The format for your next appointment:   In Person  Provider:   Dr. Gardiner Rhyme  Important Information About Sugar

## 2021-12-27 ENCOUNTER — Other Ambulatory Visit: Payer: Self-pay | Admitting: Cardiology

## 2021-12-27 ENCOUNTER — Encounter: Payer: Self-pay | Admitting: Cardiology

## 2021-12-27 MED ORDER — VALSARTAN 80 MG PO TABS: 80.0000 mg | ORAL_TABLET | Freq: Every day | ORAL | 3 refills | Status: DC

## 2021-12-27 MED ORDER — AMLODIPINE BESYLATE 5 MG PO TABS: 5.0000 mg | ORAL_TABLET | Freq: Every day | ORAL | 3 refills | Status: DC

## 2022-01-02 ENCOUNTER — Ambulatory Visit (HOSPITAL_COMMUNITY): Payer: 59 | Attending: Cardiology

## 2022-01-02 DIAGNOSIS — I351 Nonrheumatic aortic (valve) insufficiency: Secondary | ICD-10-CM | POA: Diagnosis present

## 2022-01-02 LAB — ECHOCARDIOGRAM COMPLETE
Area-P 1/2: 2.84 cm2
P 1/2 time: 424 msec
S' Lateral: 2.85 cm

## 2022-01-21 ENCOUNTER — Ambulatory Visit
Admission: RE | Admit: 2022-01-21 | Discharge: 2022-01-21 | Disposition: A | Discharge status: Home / Self Care | Payer: 59 | Source: Ambulatory Visit | Attending: Cardiology | Admitting: Cardiology

## 2022-01-21 DIAGNOSIS — R911 Solitary pulmonary nodule: Secondary | ICD-10-CM

## 2022-04-04 ENCOUNTER — Encounter: Payer: Self-pay | Admitting: Cardiology

## 2022-04-04 MED ORDER — CHLORTHALIDONE 25 MG PO TABS: 25.0000 mg | ORAL_TABLET | Freq: Every day | ORAL | 2 refills | Status: DC

## 2022-04-04 MED ORDER — ROSUVASTATIN CALCIUM 40 MG PO TABS: 40.0000 mg | ORAL_TABLET | Freq: Every day | ORAL | 2 refills | Status: DC

## 2022-05-22 IMAGING — CT CT ANGIO CHEST
3 of 8 series · 18 of 46 positions shown · IV contrast (omnipaque)
Comparison: None.

CLINICAL DATA: Evaluate aortic dilatation. History of a bicuspid
aortic valve. Aortic dilatation identified on echocardiography.

EXAM:
CT ANGIOGRAPHY CHEST WITH CONTRAST
TECHNIQUE: Multidetector CT imaging of the chest was performed using the
standard protocol during bolus administration of intravenous
contrast. Multiplanar CT image reconstructions and MIPs were
obtained to evaluate the vascular anatomy.
CONTRAST:  100mL OMNIPAQUE IOHEXOL 350 MG/ML SOLN

[Series 4: aorta 3.0 bf37 2 · axial · 0.72mm/px · z∈[-326,-38]mm · 13 of 113 slices shown]
[im 9/113  lung]
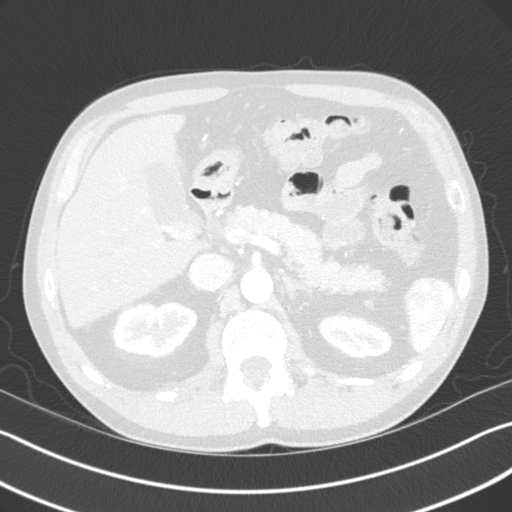
[im 17/113  soft-tissue]
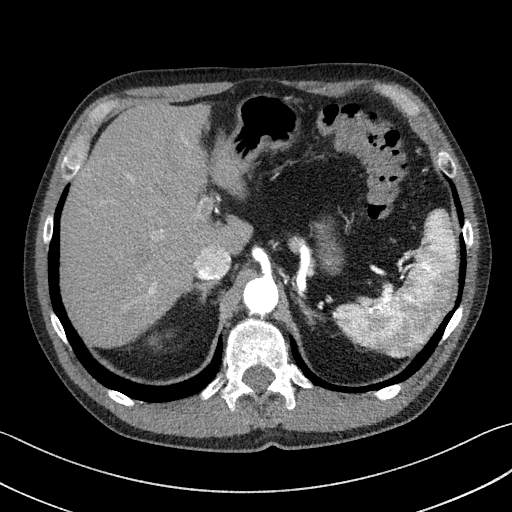
[im 25/113  lung]
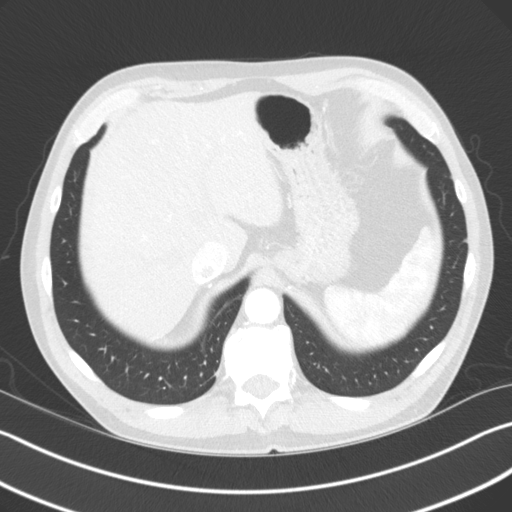
[im 33/113  soft-tissue]
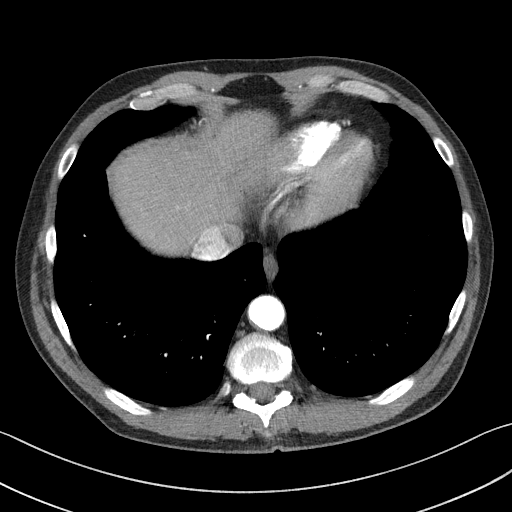
[im 41/113  lung]
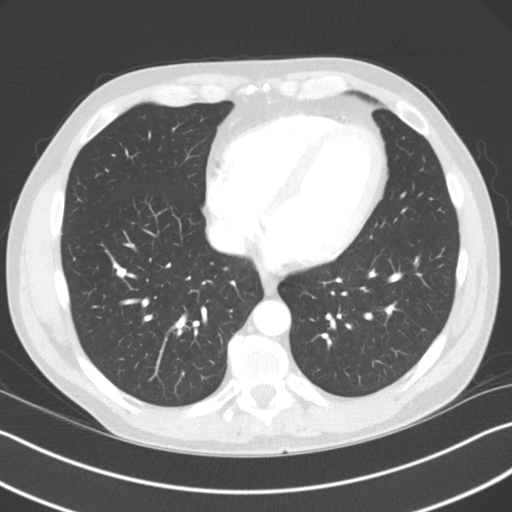
[im 49/113  soft-tissue]
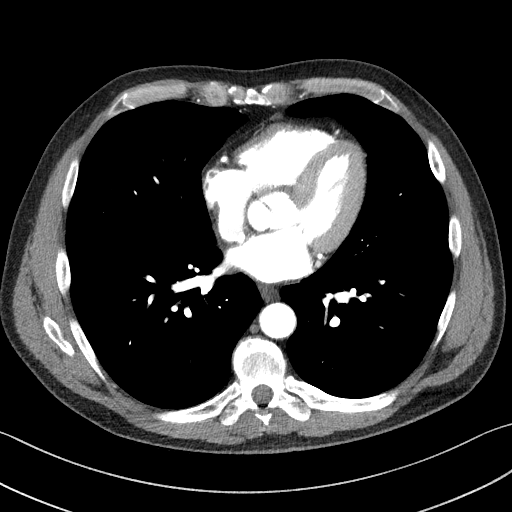
[im 57/113  lung]
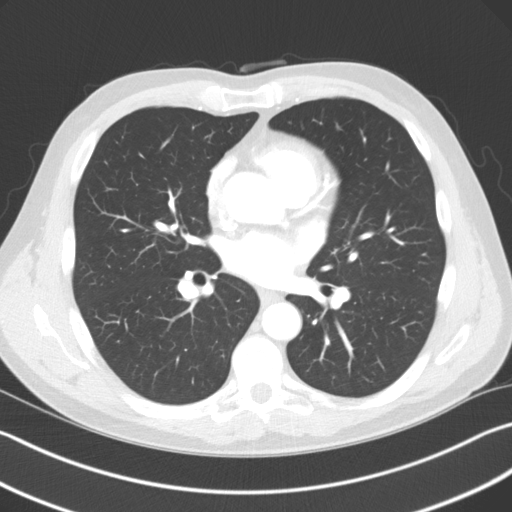
[im 65/113  soft-tissue]
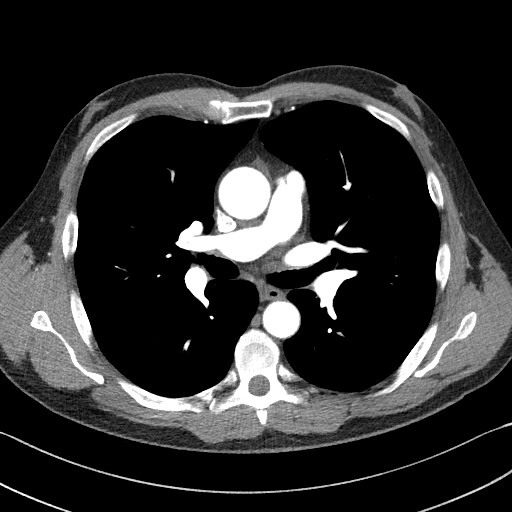
[im 73/113  lung]
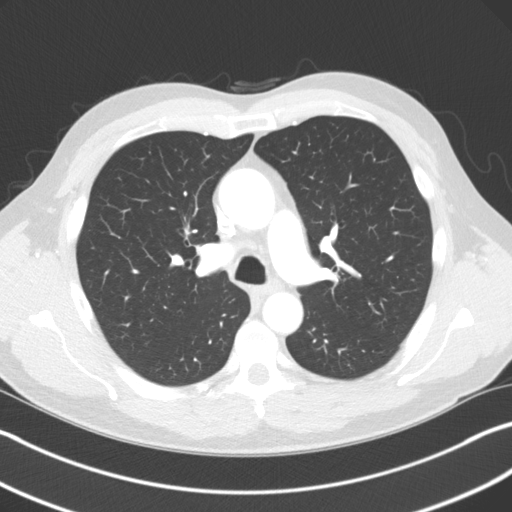
[im 81/113  soft-tissue]
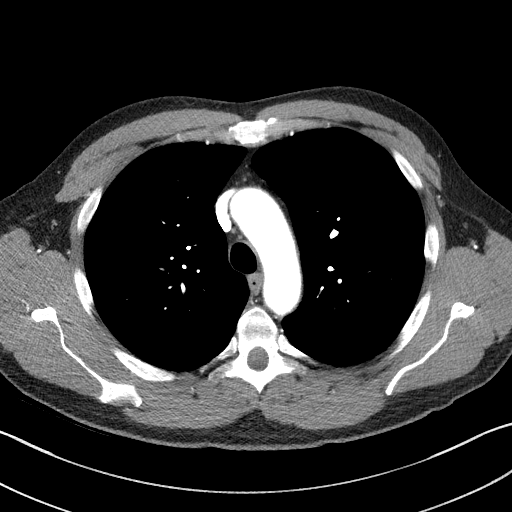
[im 89/113  lung]
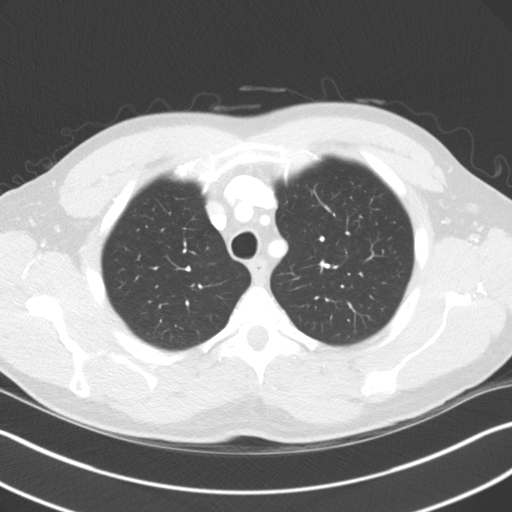
[im 97/113  soft-tissue]
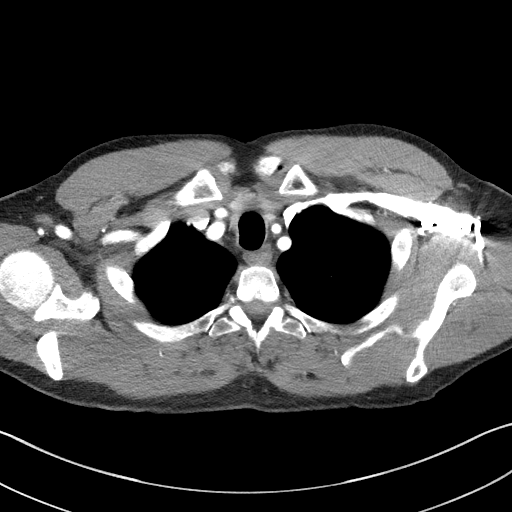
[im 105/113  lung]
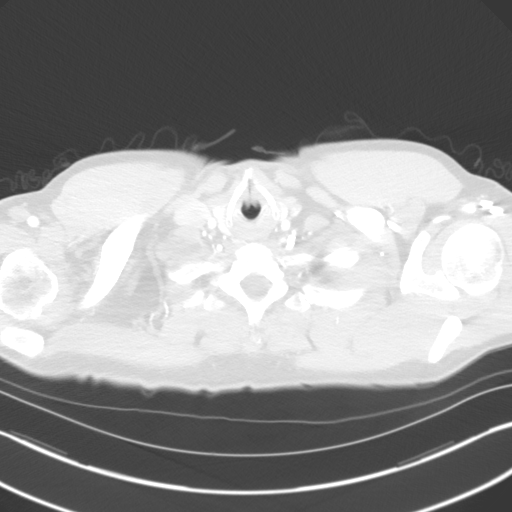

[Series 5: lung · axial · 0.72mm/px · z∈[-326,-278]mm · 2 of 113 slices shown]
[im 9/113  soft-tissue]
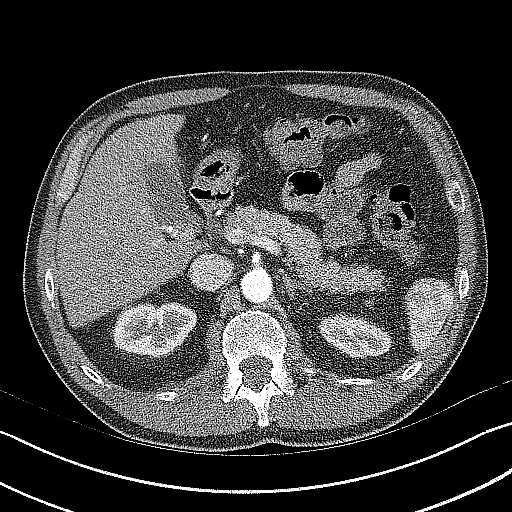
[im 25/113  soft-tissue]
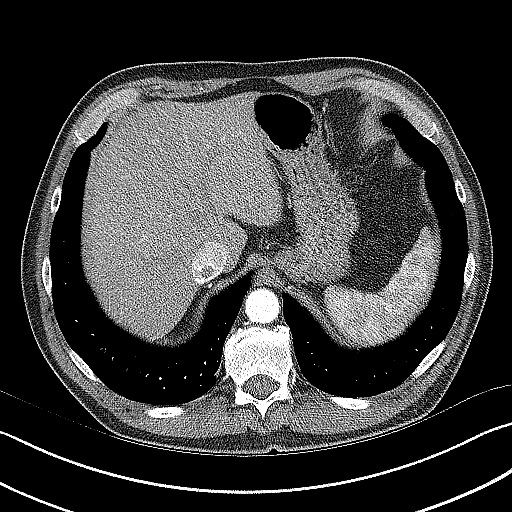

[Series 7: coronals · coronal · 0.68mm/px · 3 of 140 slices shown]
[im 35/140  soft-tissue]
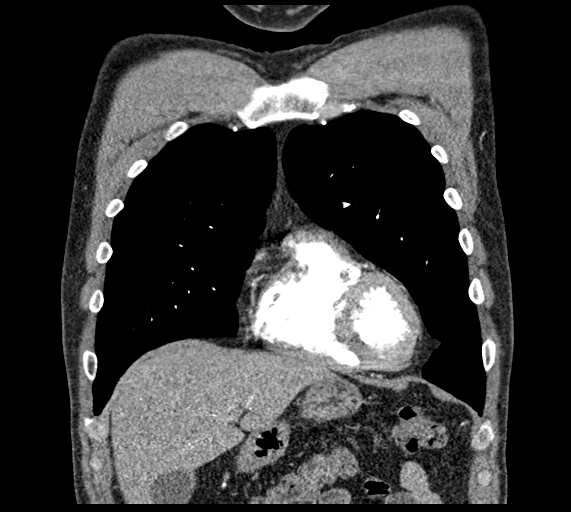
[im 70/140  soft-tissue]
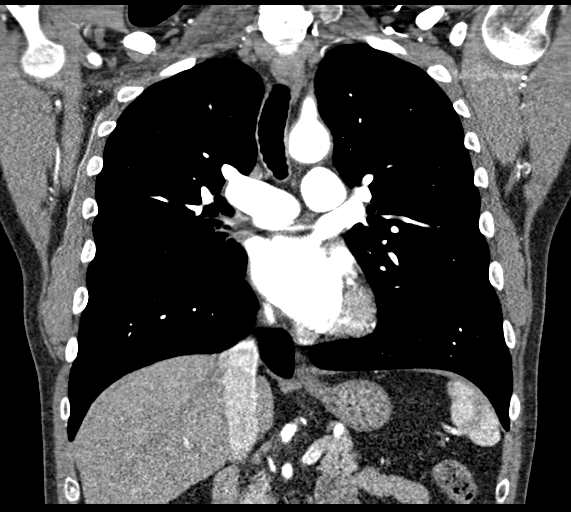
[im 105/140  soft-tissue]
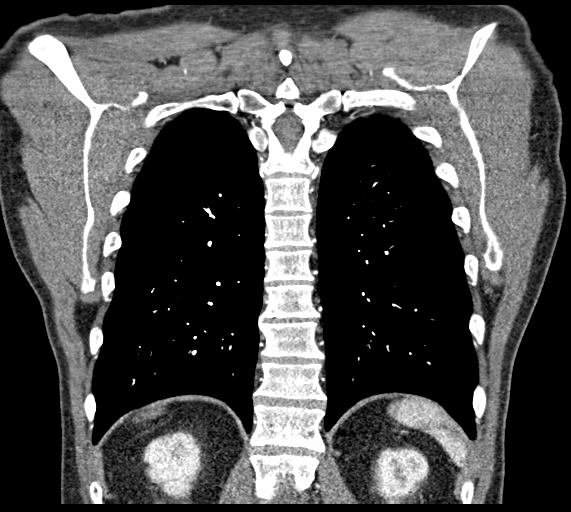

[18 of 46 positions shown; findings below may reference images not displayed]

FINDINGS: Cardiovascular: Caliber of the ascending thoracic aorta is within
normal limits. The ascending thoracic aorta measures up to 3.7 cm.
No evidence for aortic dissection. Typical three-vessel arch
anatomy. Great vessels are widely patent. Minimal noncalcified
plaque in the proximal left subclavian artery. Proximal descending
thoracic aorta is 2.8 cm. Imaging of the abdominal aorta
demonstrates a patent celiac trunk, SMA and main bilateral renal
arteries. Incidentally, there is a replaced right hepatic artery
coming off the SMA. Heart size is normal. No significant pericardial
fluid. Main pulmonary arteries are patent. Minimal atherosclerotic
calcifications involving the aortic arch.

Mediastinum/Nodes: Mediastinal structures are unremarkable.

Lungs/Pleura: No pleural effusions. 3 mm peripheral nodule in the
left upper lobe on sequence 5, image 19. No significant airspace
disease or lung consolidation. Tiny nodule in the lingula on
sequence 5 image 64.

Upper Abdomen: Multiple gallstones. Otherwise, images of the upper
abdomen are unremarkable.

Musculoskeletal: No acute bone abnormality.

Review of the MIP images confirms the above findings.
IMPRESSION: 1. No evidence for thoracic aortic aneurysm. Maximum diameter of the
ascending thoracic aorta measures 3.7 cm.
2. Two tiny pulmonary nodules. These pulmonary nodules are
indeterminate. No follow-up needed if patient is low-risk (and has
no known or suspected primary neoplasm). Non-contrast chest CT can
be considered in 12 months if patient is high-risk. This
recommendation follows the consensus statement: Guidelines for
Management of Incidental Pulmonary Nodules Detected on CT Images:
3. Cholelithiasis.

## 2022-07-03 ENCOUNTER — Other Ambulatory Visit: Payer: Self-pay | Admitting: Cardiology

## 2022-12-17 DIAGNOSIS — M65872 Other synovitis and tenosynovitis, left ankle and foot: Secondary | ICD-10-CM | POA: Diagnosis not present

## 2022-12-17 DIAGNOSIS — M722 Plantar fascial fibromatosis: Secondary | ICD-10-CM | POA: Diagnosis not present

## 2022-12-22 NOTE — Progress Notes (Signed)
Cardiology Office Note:    Date:  12/23/2022   ID:  Steven Hanson, DOB 1955-11-05, MRN [id number]  PCP:  Soundra Pilon, FNP  Cardiologist:  Little Ishikawa, MD  Electrophysiologist:  None   Referring MD: Soundra Pilon, FNP   No chief complaint on file.    History of Present Illness:     Steven Hanson is a 67 y.o. male with a hx of mitral regurgitation, aortic regurgitation, hyperlipidemia, hypertension, prostate cancer who presents for follow-up.  He was referred by Dr. Zachery Dauer for evaluation of valvular heart disease, initially seen on 12/03/2019.    Echocardiogram on 11/01/2019 showed LVEF 60 to 65%, redundant mitral valve cords, mild MR, mild AI, mild PS, ascending aorta dilatation measuring 41 mm.  CTA chest on 12/16/2019 showed max diameter of ascending aorta measured 37 mm, also with small pulmonary nodules.  Calcium score on 03/21/2020 was 192 (73rd percentile).  Echocardiogram 01/02/2022 showed EF 60 to 65%, mild LVH, normal RV function, mild MR, mild AI, mild PS.  Since last clinic visit, he reports he is doing well.  Denies any chest pain, dyspnea, lightheadedness, syncope, lower extremity edema, or palpitations.  Runs 2 miles 5 days per week, denies any exertional symptoms.  Reports BP 110/70s when checks at home.     Past Medical History:  Diagnosis Date   Aortic valve insufficiency    11/11/2014 per H&P per Dr Donnie Aho    Cancer [facility])    GERD (gastroesophageal reflux disease)    Heart murmur    Hyperlipidemia    per H&P per Dr Donnie Aho 11/11/2014   Hypertension    Mitral valve regurgitation    per H&P per Dr Donnie Aho 11/11/2014   Vertigo    Wears glasses     Past Surgical History:  Procedure Laterality Date   cyst removed      pt not sure which toe or on which foot 1988-1989   LYMPH NODE DISSECTION Bilateral 06/30/2015   Procedure: BILATERAL PELVIC LYMPH NODE DISSECTION;  Surgeon: Crist Fat, MD;  Location: [facility];  Service: Urology;  Laterality: Bilateral;    ROBOT ASSISTED LAPAROSCOPIC RADICAL PROSTATECTOMY N/A 06/30/2015   Procedure: ROBOTIC ASSISTED LAPAROSCOPIC RADICAL PROSTATECTOMY;  Surgeon: Crist Fat, MD;  Location: [facility];  Service: Urology;  Laterality: N/A;    Current Medications: Current Meds  Medication Sig   latanoprost (XALATAN) 0.005 % ophthalmic solution 1 drop at bedtime.   Melatonin 1 MG CAPS 1 capsule at bedtime as needed   Omega 3 1000 MG CAPS 1 capsule   omeprazole (PRILOSEC OTC) 20 MG tablet 1 tablet 30 minutes before morning meal   [DISCONTINUED] chlorthalidone (HYGROTON) 25 MG tablet Take 1 tablet (25 mg total) by mouth daily.   [DISCONTINUED] ezetimibe (ZETIA) 10 MG tablet Take 1 tablet by mouth once daily   [DISCONTINUED] rosuvastatin (CRESTOR) 40 MG tablet Take 1 tablet (40 mg total) by mouth daily.   [DISCONTINUED] valsartan (DIOVAN) 80 MG tablet Take 1 tablet (80 mg total) by mouth daily.     Allergies:   Lisinopril and Losartan   Social History   Socioeconomic History   Marital status: Widowed    Spouse name: Not on file   Number of children: Not on file   Years of education: Not on file   Highest education level: Not on file  Occupational History   Not on file  Tobacco Use   Smoking status: Never   Smokeless tobacco: Never  Substance and Sexual  Activity   Alcohol use: Yes    Comment: several glasses of wine on weekend   Drug use: No   Sexual activity: Not on file  Other Topics Concern   Not on file  Social History Narrative   Not on file   Social Determinants of Health   Financial Resource Strain: Not on file  Food Insecurity: Not on file  Transportation Needs: Not on file  Physical Activity: Not on file  Stress: Not on file  Social Connections: Not on file     Family History: [name] family history is not on file.  ROS:   Please see the history of present illness.     All other systems reviewed and are negative.  EKGs/Labs/Other Studies Reviewed:    The following  studies were reviewed today:   EKG:   12/23/22: NSR, LAD, rate 72, LVH 12/21/2021: Normal sinus rhythm, rate 71, left axis deviation, nonspecific intraventricular conduction delay, no ST abnormalities 04/24/21-normal sinus rhythm, rate 79, no ST abnormalities, T wave inversion in lead III 4/22- normal sinus rhythm, rate 83, no ST abnormalities 6/21- normal sinus rhythm, rate 82, no ST/T abnormalities  Recent Labs: No results found for requested labs within last 365 days.  Recent Lipid Panel    Component Value Date/Time   CHOL 150 04/24/2021 1653   TRIG 180 (H) 04/24/2021 1653   HDL 42 04/24/2021 1653   CHOLHDL 3.6 04/24/2021 1653   LDLCALC 77 04/24/2021 1653    Physical Exam:    VS:  BP 108/78   Pulse 72   Ht 5\' 10"  (1.778 m)   Wt 187 lb (84.8 kg)   SpO2 98%   BMI 26.83 kg/m     Wt Readings from Last 3 Encounters:  12/23/22 187 lb (84.8 kg)  12/21/21 188 lb 9.6 oz (85.5 kg)  04/24/21 186 lb 9.6 oz (84.6 kg)     GEN: Well nourished, well developed in no acute distress HEENT: Normal NECK: No JVD; No carotid bruits LYMPHATICS: No lymphadenopathy CARDIAC: RRR, no murmurs, rubs, gallops RESPIRATORY:  Clear to auscultation without rales, wheezing or rhonchi  ABDOMEN: Soft, non-tender, non-distended MUSCULOSKELETAL:  No edema; No deformity  SKIN: Warm and dry NEUROLOGIC:  Alert and oriented x 3 PSYCHIATRIC:  Normal affect   ASSESSMENT:    1. Mitral valve insufficiency, unspecified etiology   2. Medication management   3. Aortic valve insufficiency, etiology of cardiac valve disease unspecified   4. Essential hypertension   5. Hyperlipidemia, unspecified hyperlipidemia type   6. CKD stage 3a, GFR 45-59 ml/min (HCC)     PLAN:     Valvular heart disease: Mild AI, mild MR, mild PS on TTE 11/01/2019.  Repeat echocardiogram 12/2021 showed stable valvular disease, with mild AI/MR/PS.  Aortic dilatation: Ascending aorta measured 41 mm on echocardiogram 11/01/2019.  CTA  chest on 12/16/2019 showed max diameter of ascending aorta measured 37 mm  Hypertension: Developed hyperkalemia (K 5.9) on losartan, was switched to amlodipine.  Currently on amlodipine 5 mg daily and chlorthalidone 25 mg daily and valsartan 80 mg daily.  BP appears controlled.    Hyperlipidemia: LDL 148 on 10/07/2019.  10-year ASCVD risk score is 14%.  Switched from pravastatin 20 mg to rosuvastatin 10 mg daily in June 2021.  LDL 109 on 01/07/2020.  Calcium score on 03/21/2020 was 192 (73rd percentile).  Switched to rosuvastatin 20 mg daily.  LDL 113 on 09/26/2020, rosuvastatin increased to 40 mg daily.  LDL 77 on 04/24/2021.  Zetia  10 mg daily added, LDL 41 on 11/26/2022  CKD stage IIIa: Creatinine 1.5 on 11/26/2022.  Check BMET, magnesium  Lung nodule: Small lung nodules on CT chest 12/2019.  Repeat CT chest 12/2020 showed lung nodule has resolved, suspect likely infectious etiology.  However was a new lung nodule seen, follow-up chest CT recommended in 1 year.  CT chest on 01/2022 showed nodule seen on prior study was not present, no follow-up recommended.  RTC in 1 year   Medication Adjustments/Labs and Tests Ordered: Current medicines are reviewed at length with the patient today.  Concerns regarding medicines are outlined above.  Orders Placed This Encounter  Procedures   Basic Metabolic Panel (BMET)   Magnesium   EKG 12-Lead    Meds ordered this encounter  Medications   amLODipine (NORVASC) 5 MG tablet    Sig: Take 1 tablet (5 mg total) by mouth daily.    Dispense:  90 tablet    Refill:  3   chlorthalidone (HYGROTON) 25 MG tablet    Sig: Take 1 tablet (25 mg total) by mouth daily.    Dispense:  90 tablet    Refill:  2    Dose increase   ezetimibe (ZETIA) 10 MG tablet    Sig: Take 1 tablet (10 mg total) by mouth daily.    Dispense:  90 tablet    Refill:  3   rosuvastatin (CRESTOR) 40 MG tablet    Sig: Take 1 tablet (40 mg total) by mouth daily.    Dispense:  90 tablet    Refill:   3   valsartan (DIOVAN) 80 MG tablet    Sig: Take 1 tablet (80 mg total) by mouth daily.    Dispense:  90 tablet    Refill:  3     Patient Instructions  Medication Instructions:  Your physician recommends that you continue on your current medications as directed. Please refer to the Current Medication list given to you today.  *If you need a refill on your cardiac medications before your next appointment, please call your pharmacy*   Lab Work: Your physician recommends that you have labs drawn today: BMET, Mag If you have labs (blood work) drawn today and your tests are completely normal, you will receive your results only by: MyChart Message (if you have MyChart) OR A paper copy in the mail If you have any lab test that is abnormal or we need to change your treatment, we will call you to review the results.   Testing/Procedures: None   Follow-Up: At [facility], you and your health needs are our priority.  As part of our continuing mission to provide you with exceptional heart care, we have created designated Provider Care Teams.  These Care Teams include your primary Cardiologist (physician) and Advanced Practice Providers (APPs -  Physician Assistants and Nurse Practitioners) who all work together to provide you with the care you need, when you need it.   Your next appointment:   1 year(s)  Provider:   Little Ishikawa, MD    Signed, Little Ishikawa, MD  12/23/2022 8:34 AM    [facility]

## 2022-12-23 ENCOUNTER — Ambulatory Visit: Payer: Self-pay | Attending: Cardiology | Admitting: Cardiology

## 2022-12-23 VITALS — BP 108/78 | HR 72 | Ht 70.0 in | Wt 187.0 lb

## 2022-12-23 DIAGNOSIS — I10 Essential (primary) hypertension: Secondary | ICD-10-CM

## 2022-12-23 DIAGNOSIS — Z79899 Other long term (current) drug therapy: Secondary | ICD-10-CM | POA: Diagnosis not present

## 2022-12-23 DIAGNOSIS — I351 Nonrheumatic aortic (valve) insufficiency: Secondary | ICD-10-CM

## 2022-12-23 DIAGNOSIS — N1831 Chronic kidney disease, stage 3a: Secondary | ICD-10-CM

## 2022-12-23 DIAGNOSIS — I340 Nonrheumatic mitral (valve) insufficiency: Secondary | ICD-10-CM | POA: Diagnosis not present

## 2022-12-23 DIAGNOSIS — E785 Hyperlipidemia, unspecified: Secondary | ICD-10-CM

## 2022-12-23 MED ORDER — AMLODIPINE BESYLATE 5 MG PO TABS: 5.0000 mg | ORAL_TABLET | Freq: Every day | ORAL | 3 refills | Status: DC

## 2022-12-23 MED ORDER — VALSARTAN 80 MG PO TABS: 80.0000 mg | ORAL_TABLET | Freq: Every day | ORAL | 3 refills | Status: DC

## 2022-12-23 MED ORDER — EZETIMIBE 10 MG PO TABS: 10.0000 mg | ORAL_TABLET | Freq: Every day | ORAL | 3 refills | Status: DC

## 2022-12-23 MED ORDER — CHLORTHALIDONE 25 MG PO TABS: 25.0000 mg | ORAL_TABLET | Freq: Every day | ORAL | 2 refills | Status: DC

## 2022-12-23 MED ORDER — ROSUVASTATIN CALCIUM 40 MG PO TABS: 40.0000 mg | ORAL_TABLET | Freq: Every day | ORAL | 3 refills | Status: DC

## 2022-12-23 NOTE — Patient Instructions (Addendum)
Medication Instructions:  Your physician recommends that you continue on your current medications as directed. Please refer to the Current Medication list given to you today.  *If you need a refill on your cardiac medications before your next appointment, please call your pharmacy*   Lab Work: Your physician recommends that you have labs drawn today: BMET, Mag If you have labs (blood work) drawn today and your tests are completely normal, you will receive your results only by: MyChart Message (if you have MyChart) OR A paper copy in the mail If you have any lab test that is abnormal or we need to change your treatment, we will call you to review the results.   Testing/Procedures: None   Follow-Up: At [facility], you and your health needs are our priority.  As part of our continuing mission to provide you with exceptional heart care, we have created designated Provider Care Teams.  These Care Teams include your primary Cardiologist (physician) and Advanced Practice Providers (APPs -  Physician Assistants and Nurse Practitioners) who all work together to provide you with the care you need, when you need it.   Your next appointment:   1 year(s)  Provider:   Little Ishikawa, MD

## 2022-12-24 ENCOUNTER — Telehealth: Payer: Self-pay | Admitting: *Deleted

## 2022-12-24 DIAGNOSIS — I10 Essential (primary) hypertension: Secondary | ICD-10-CM

## 2022-12-24 LAB — BASIC METABOLIC PANEL
BUN/Creatinine Ratio: 17 (ref 10–24)
BUN: 22 mg/dL (ref 8–27)
CO2: 26 mmol/L (ref 20–29)
Calcium: 9.8 mg/dL (ref 8.6–10.2)
Chloride: 95 mmol/L — ABNORMAL LOW (ref 96–106)
Creatinine, Ser: 1.28 mg/dL — ABNORMAL HIGH (ref 0.76–1.27)
Glucose: 113 mg/dL — ABNORMAL HIGH (ref 70–99)
Potassium: 4.3 mmol/L (ref 3.5–5.2)
Sodium: 133 mmol/L — ABNORMAL LOW (ref 134–144)
eGFR: 62 mL/min/{1.73_m2} (ref >59)

## 2022-12-24 LAB — MAGNESIUM: Magnesium: 1.5 mg/dL — ABNORMAL LOW (ref 1.6–2.3)

## 2022-12-24 MED ORDER — CHLORTHALIDONE 25 MG PO TABS: 12.5000 mg | ORAL_TABLET | Freq: Every day | ORAL | Status: DC

## 2022-12-24 MED ORDER — MAGNESIUM OXIDE 400 MG PO CAPS: 400.0000 mg | ORAL_CAPSULE | Freq: Every day | ORAL | Status: AC

## 2022-12-24 NOTE — Telephone Encounter (Signed)
-----   Message from Little Ishikawa sent at 12/24/2022  6:27 AM EDT ----- Magnesium is reduced.  BP has appeared improved, recommend decreasing chlorthalidone dose to 12.5 mg daily and start magnesium oxide 400 mg daily.  Recheck BMET/magnesium in 2 weeks.  Recommend checking BP daily for next 2 weeks and let us know results

## 2022-12-24 NOTE — Telephone Encounter (Signed)
Spoke with pt, aware of the recommendations. Lab orders mailed to the pt

## 2022-12-31 DIAGNOSIS — M65872 Other synovitis and tenosynovitis, left ankle and foot: Secondary | ICD-10-CM | POA: Diagnosis not present

## 2023-01-06 DIAGNOSIS — I10 Essential (primary) hypertension: Secondary | ICD-10-CM | POA: Diagnosis not present

## 2023-01-27 ENCOUNTER — Encounter: Payer: Self-pay | Admitting: Cardiology

## 2023-01-28 ENCOUNTER — Telehealth: Payer: Self-pay | Admitting: Cardiology

## 2023-01-28 DIAGNOSIS — I10 Essential (primary) hypertension: Secondary | ICD-10-CM

## 2023-01-28 DIAGNOSIS — R252 Cramp and spasm: Secondary | ICD-10-CM

## 2023-01-28 NOTE — Telephone Encounter (Signed)
Would not expect magnesium supplement to cause cramps, but low magnesium or potassium can cause cramps.  Recent prednisone use could be contributing.  Would suggest starting with checking a BMET/magnesium.  Is he still taking the prednisone or has he finished this course?

## 2023-01-28 NOTE — Telephone Encounter (Signed)
Will forward this message to [facility].

## 2023-01-28 NOTE — Telephone Encounter (Signed)
Pt c/o medication issue:  1. Name of Medication: prednisone   2. How are you currently taking this medication (dosage and times per day)?   3. Are you having a reaction (difficulty breathing--STAT)? No  4. What is your medication issue? Patient is calling to inform RN Elnita Maxwell that he has been off this medication for 2-3 weeks now. Patient did mentioned he will stop by tomorrow to complete labs.

## 2023-01-28 NOTE — Telephone Encounter (Signed)
Will send message to Dr.Schumann to let him know you finished taking prednisone.He will have a bmet and magnesium tomorrow.Orders placed.

## 2023-01-29 DIAGNOSIS — R252 Cramp and spasm: Secondary | ICD-10-CM | POA: Diagnosis not present

## 2023-01-29 DIAGNOSIS — I10 Essential (primary) hypertension: Secondary | ICD-10-CM | POA: Diagnosis not present

## 2023-01-30 DIAGNOSIS — R252 Cramp and spasm: Secondary | ICD-10-CM | POA: Diagnosis not present

## 2023-01-30 DIAGNOSIS — G8929 Other chronic pain: Secondary | ICD-10-CM | POA: Diagnosis not present

## 2023-01-30 DIAGNOSIS — M25572 Pain in left ankle and joints of left foot: Secondary | ICD-10-CM | POA: Diagnosis not present

## 2023-01-30 LAB — BASIC METABOLIC PANEL
BUN/Creatinine Ratio: 10 (ref 10–24)
BUN: 13 mg/dL (ref 8–27)
CO2: 26 mmol/L (ref 20–29)
Calcium: 9.7 mg/dL (ref 8.6–10.2)
Chloride: 92 mmol/L — ABNORMAL LOW (ref 96–106)
Creatinine, Ser: 1.24 mg/dL (ref 0.76–1.27)
Glucose: 92 mg/dL (ref 70–99)
Potassium: 4.2 mmol/L (ref 3.5–5.2)
Sodium: 132 mmol/L — ABNORMAL LOW (ref 134–144)
eGFR: 65 mL/min/{1.73_m2} (ref >59)

## 2023-01-30 LAB — MAGNESIUM: Magnesium: 1.6 mg/dL (ref 1.6–2.3)

## 2023-01-31 ENCOUNTER — Other Ambulatory Visit: Payer: Self-pay

## 2023-01-31 DIAGNOSIS — R252 Cramp and spasm: Secondary | ICD-10-CM

## 2023-01-31 DIAGNOSIS — I10 Essential (primary) hypertension: Secondary | ICD-10-CM

## 2023-02-03 DIAGNOSIS — M19072 Primary osteoarthritis, left ankle and foot: Secondary | ICD-10-CM | POA: Diagnosis not present

## 2023-02-03 DIAGNOSIS — M7672 Peroneal tendinitis, left leg: Secondary | ICD-10-CM | POA: Diagnosis not present

## 2023-02-04 DIAGNOSIS — Z1283 Encounter for screening for malignant neoplasm of skin: Secondary | ICD-10-CM | POA: Diagnosis not present

## 2023-02-04 DIAGNOSIS — D225 Melanocytic nevi of trunk: Secondary | ICD-10-CM | POA: Diagnosis not present

## 2023-02-04 DIAGNOSIS — X32XXXD Exposure to sunlight, subsequent encounter: Secondary | ICD-10-CM | POA: Diagnosis not present

## 2023-02-04 DIAGNOSIS — L570 Actinic keratosis: Secondary | ICD-10-CM | POA: Diagnosis not present

## 2023-02-08 DIAGNOSIS — M25572 Pain in left ankle and joints of left foot: Secondary | ICD-10-CM | POA: Diagnosis not present

## 2023-02-18 ENCOUNTER — Encounter: Payer: Self-pay | Admitting: Cardiology

## 2023-02-19 DIAGNOSIS — I10 Essential (primary) hypertension: Secondary | ICD-10-CM | POA: Diagnosis not present

## 2023-02-19 DIAGNOSIS — R252 Cramp and spasm: Secondary | ICD-10-CM | POA: Diagnosis not present

## 2023-02-19 LAB — BASIC METABOLIC PANEL
BUN/Creatinine Ratio: 11 (ref 10–24)
BUN: 15 mg/dL (ref 8–27)
CO2: 26 mmol/L (ref 20–29)
Calcium: 10 mg/dL (ref 8.6–10.2)
Chloride: 96 mmol/L (ref 96–106)
Creatinine, Ser: 1.41 mg/dL — ABNORMAL HIGH (ref 0.76–1.27)
Glucose: 118 mg/dL — ABNORMAL HIGH (ref 70–99)
Potassium: 4.9 mmol/L (ref 3.5–5.2)
Sodium: 134 mmol/L (ref 134–144)
eGFR: 55 mL/min/{1.73_m2} — ABNORMAL LOW (ref >59)

## 2023-02-19 LAB — MAGNESIUM: Magnesium: 2 mg/dL (ref 1.6–2.3)

## 2023-02-20 MED ORDER — AMLODIPINE BESYLATE 10 MG PO TABS: 10.0000 mg | ORAL_TABLET | Freq: Every day | ORAL | 3 refills | Status: DC

## 2023-02-21 DIAGNOSIS — M19072 Primary osteoarthritis, left ankle and foot: Secondary | ICD-10-CM | POA: Diagnosis not present

## 2023-02-21 DIAGNOSIS — M76822 Posterior tibial tendinitis, left leg: Secondary | ICD-10-CM | POA: Diagnosis not present

## 2023-04-02 DIAGNOSIS — M19072 Primary osteoarthritis, left ankle and foot: Secondary | ICD-10-CM | POA: Diagnosis not present

## 2023-04-02 DIAGNOSIS — M722 Plantar fascial fibromatosis: Secondary | ICD-10-CM | POA: Diagnosis not present

## 2023-04-10 DIAGNOSIS — K080 Exfoliation of teeth due to systemic causes: Secondary | ICD-10-CM | POA: Diagnosis not present

## 2023-04-20 IMAGING — US US ABDOMEN LIMITED
1 series · 14 of 25 positions shown · non-contrast
Comparison: None

CLINICAL DATA: Abdominal pain, RIGHT upper quadrant pain last week

EXAM:
ULTRASOUND ABDOMEN LIMITED RIGHT UPPER QUADRANT

[Series 1: us abdomen limited · 14 of 35 slices shown]
[im 1/35]
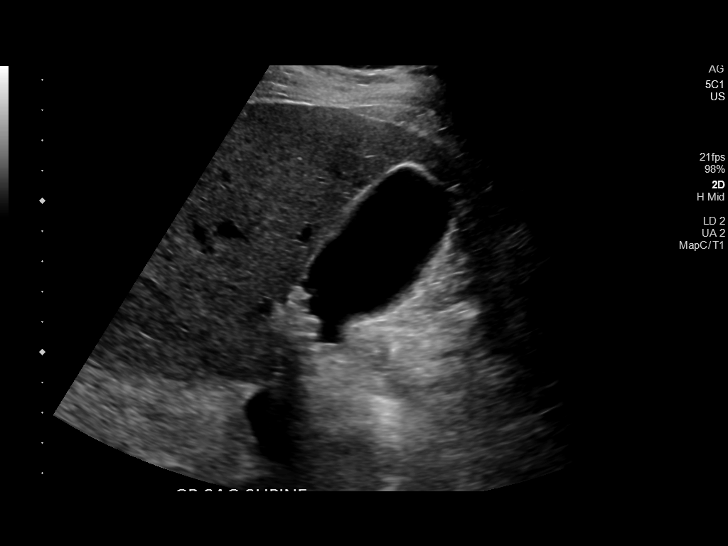
[im 3/35]
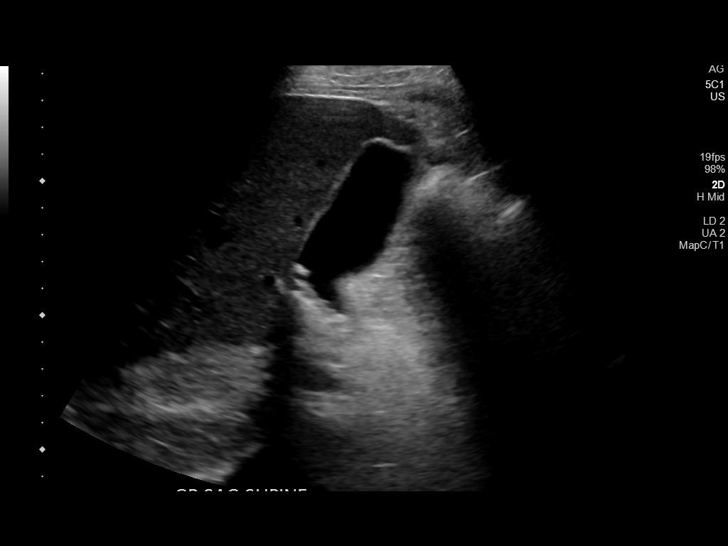
[im 6/35]
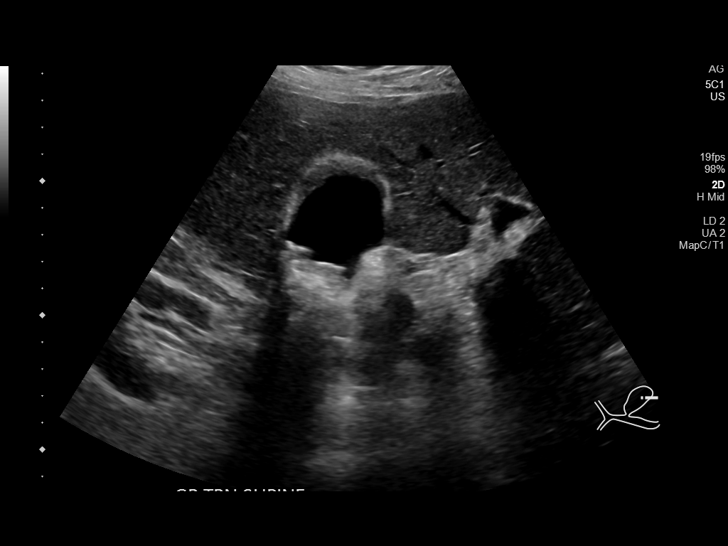
[im 9/35]
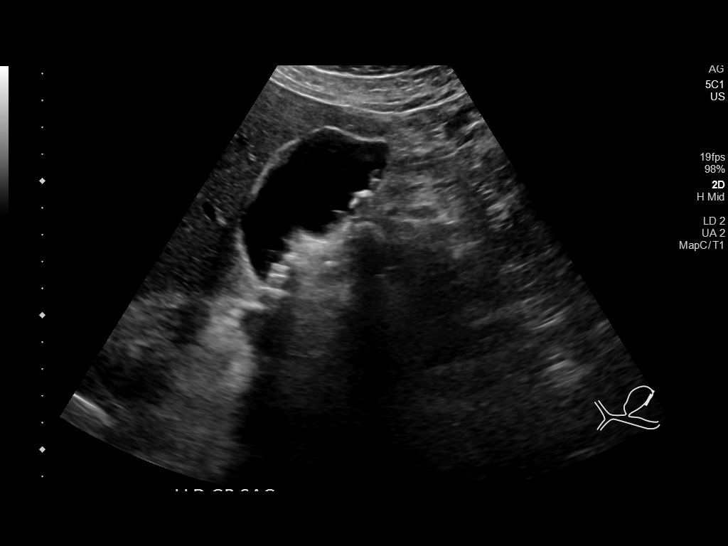
[im 12/35]
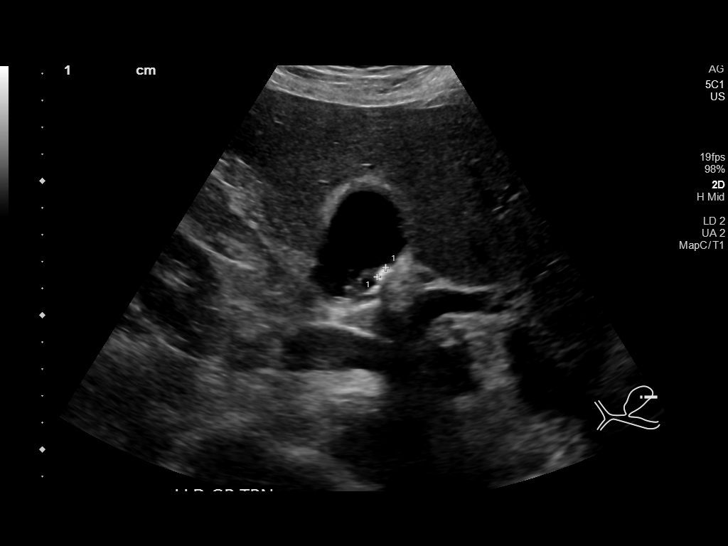
[im 13/35]
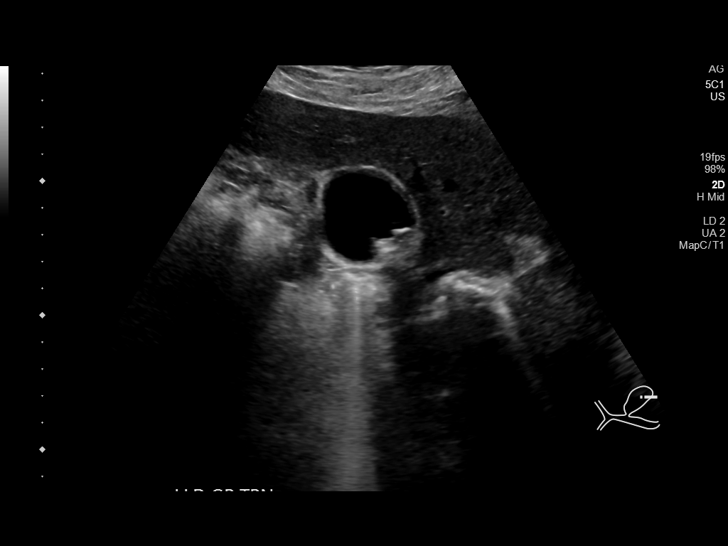
[im 16/35]
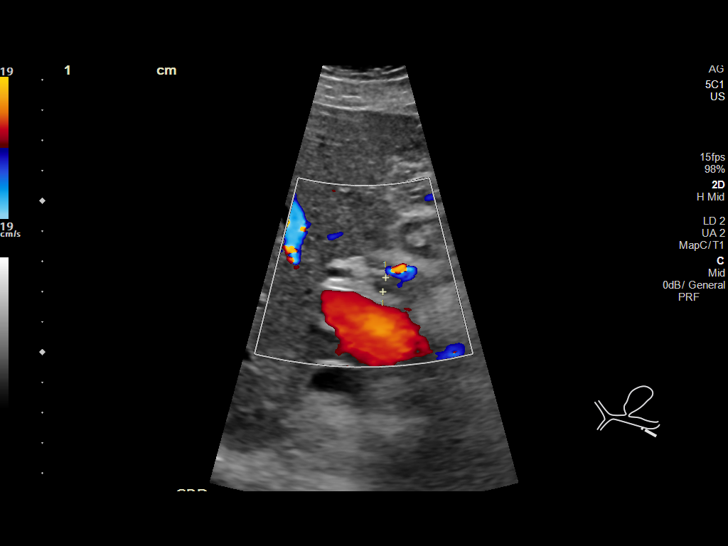
[im 19/35]
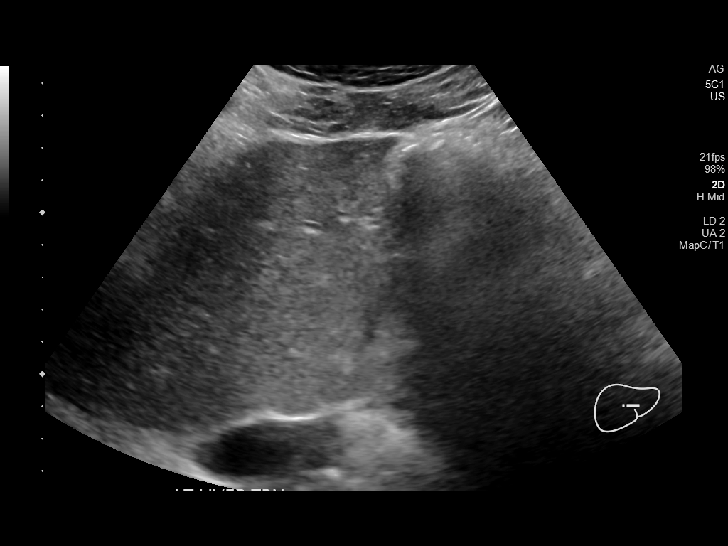
[im 22/35]
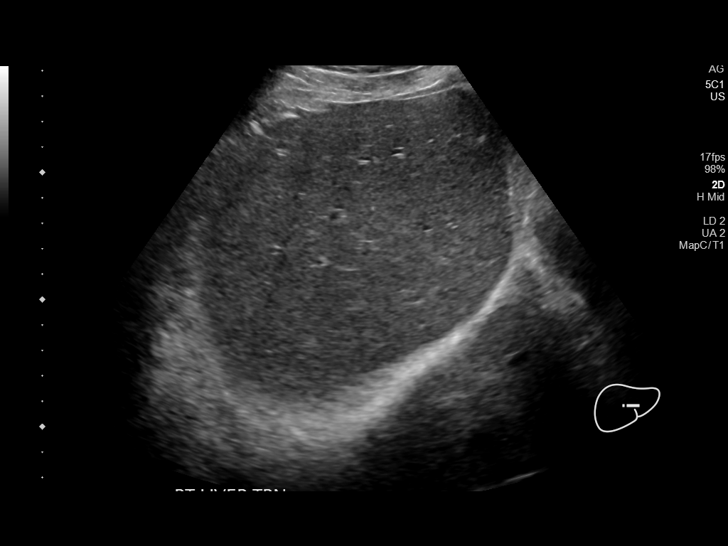
[im 23/35]
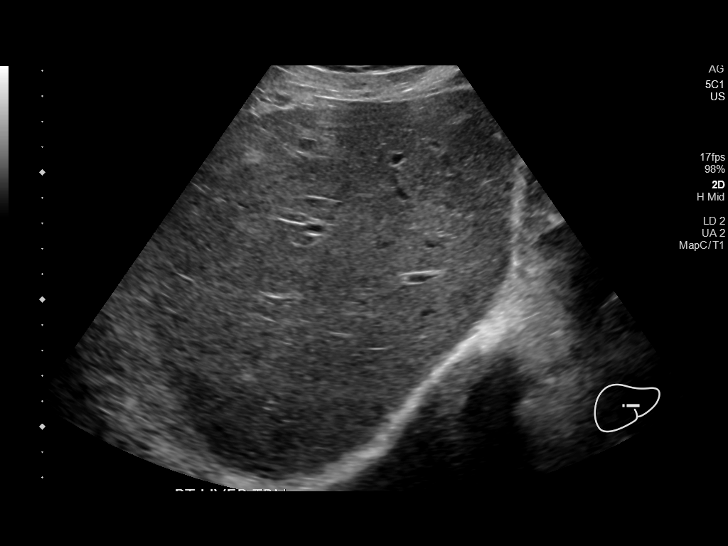
[im 26/35]
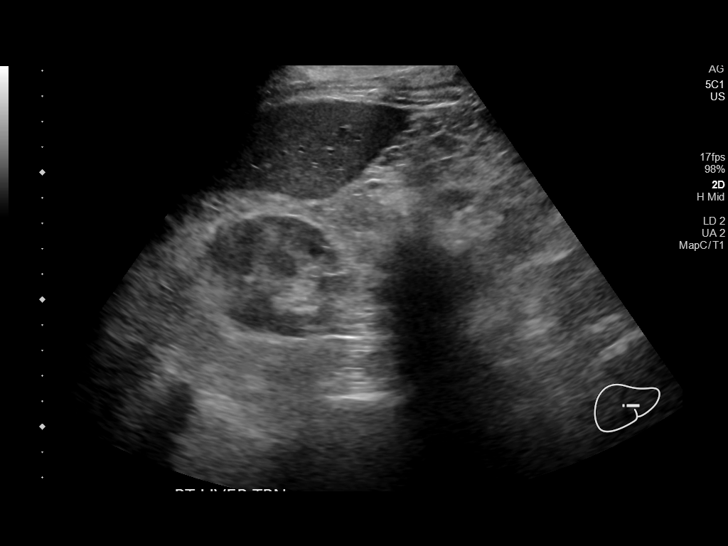
[im 29/35]
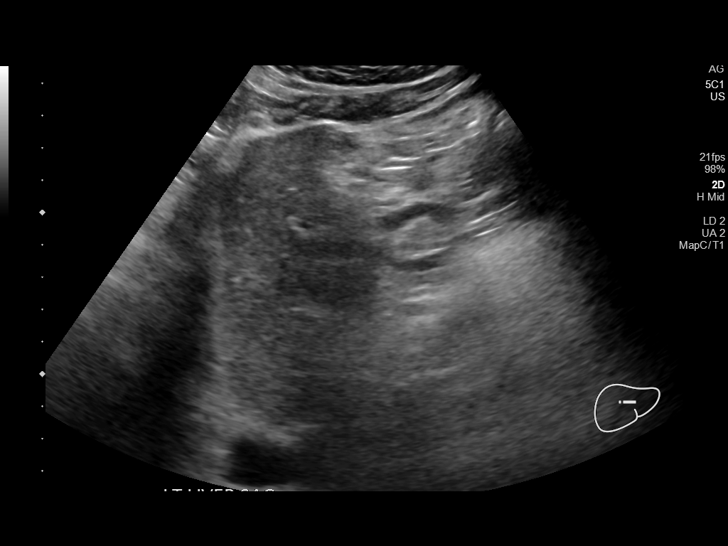
[im 32/35]
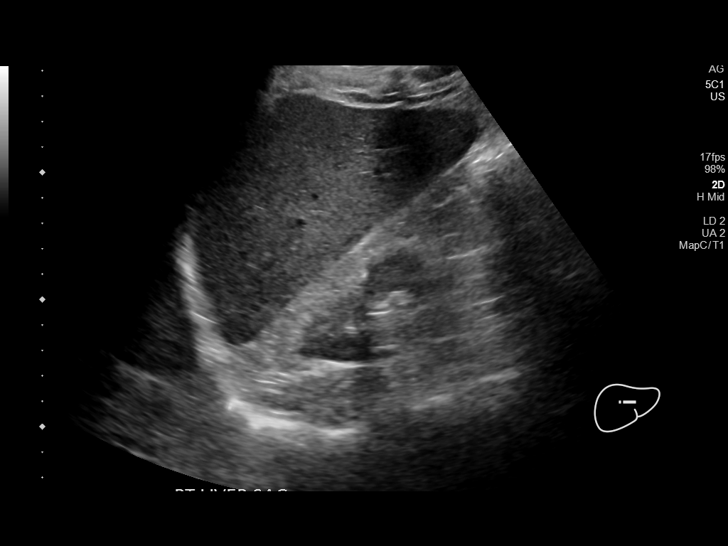
[im 35/35]
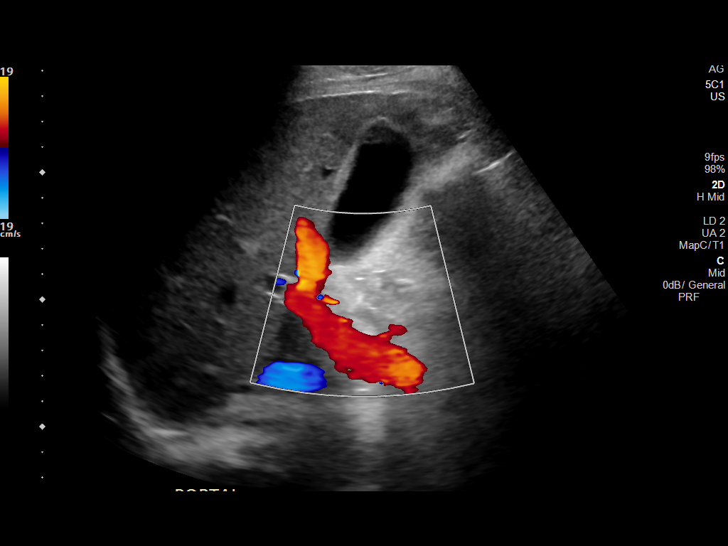

[14 of 25 positions shown; findings below may reference images not displayed]

FINDINGS: Gallbladder:

Multiple shadowing calculi within gallbladder up to 5 mm diameter.
Upper normal gallbladder wall thickness. No pericholecystic fluid or
sonographic Murphy sign.

Common bile duct:

Diameter: 5 mm, normal

Liver:

Normal echogenicity without mass or nodularity. Portal vein is
patent on color Doppler imaging with normal direction of blood flow
towards the liver.

Other: No RIGHT upper quadrant free fluid.
IMPRESSION: Cholelithiasis without evidence of acute cholecystitis or biliary
obstruction.

## 2023-05-13 DIAGNOSIS — H401132 Primary open-angle glaucoma, bilateral, moderate stage: Secondary | ICD-10-CM | POA: Diagnosis not present

## 2023-05-22 DIAGNOSIS — K080 Exfoliation of teeth due to systemic causes: Secondary | ICD-10-CM | POA: Diagnosis not present

## 2023-05-26 DIAGNOSIS — M19072 Primary osteoarthritis, left ankle and foot: Secondary | ICD-10-CM | POA: Diagnosis not present

## 2023-06-02 DIAGNOSIS — M19072 Primary osteoarthritis, left ankle and foot: Secondary | ICD-10-CM | POA: Diagnosis not present

## 2023-06-09 DIAGNOSIS — M19072 Primary osteoarthritis, left ankle and foot: Secondary | ICD-10-CM | POA: Diagnosis not present

## 2023-06-16 DIAGNOSIS — M19072 Primary osteoarthritis, left ankle and foot: Secondary | ICD-10-CM | POA: Diagnosis not present

## 2023-07-14 DIAGNOSIS — M19072 Primary osteoarthritis, left ankle and foot: Secondary | ICD-10-CM | POA: Diagnosis not present

## 2023-08-08 DIAGNOSIS — M2141 Flat foot [pes planus] (acquired), right foot: Secondary | ICD-10-CM | POA: Diagnosis not present

## 2023-08-08 DIAGNOSIS — M2142 Flat foot [pes planus] (acquired), left foot: Secondary | ICD-10-CM | POA: Diagnosis not present

## 2023-08-14 ENCOUNTER — Encounter: Payer: Self-pay | Admitting: Cardiology

## 2023-09-04 DIAGNOSIS — R059 Cough, unspecified: Secondary | ICD-10-CM | POA: Diagnosis not present

## 2023-09-04 DIAGNOSIS — N1831 Chronic kidney disease, stage 3a: Secondary | ICD-10-CM | POA: Diagnosis not present

## 2023-09-17 DIAGNOSIS — H53453 Other localized visual field defect, bilateral: Secondary | ICD-10-CM | POA: Diagnosis not present

## 2023-09-17 DIAGNOSIS — H401132 Primary open-angle glaucoma, bilateral, moderate stage: Secondary | ICD-10-CM | POA: Diagnosis not present

## 2023-09-18 DIAGNOSIS — R059 Cough, unspecified: Secondary | ICD-10-CM | POA: Diagnosis not present

## 2023-09-18 DIAGNOSIS — Z6828 Body mass index (BMI) 28.0-28.9, adult: Secondary | ICD-10-CM | POA: Diagnosis not present

## 2023-09-18 DIAGNOSIS — H9201 Otalgia, right ear: Secondary | ICD-10-CM | POA: Diagnosis not present

## 2023-09-23 DIAGNOSIS — H53453 Other localized visual field defect, bilateral: Secondary | ICD-10-CM | POA: Diagnosis not present

## 2023-09-23 DIAGNOSIS — H35033 Hypertensive retinopathy, bilateral: Secondary | ICD-10-CM | POA: Diagnosis not present

## 2023-09-23 DIAGNOSIS — H401132 Primary open-angle glaucoma, bilateral, moderate stage: Secondary | ICD-10-CM | POA: Diagnosis not present

## 2023-09-23 DIAGNOSIS — H5203 Hypermetropia, bilateral: Secondary | ICD-10-CM | POA: Diagnosis not present

## 2023-10-09 ENCOUNTER — Telehealth: Payer: Self-pay

## 2023-10-09 DIAGNOSIS — Z86018 Personal history of other benign neoplasm: Secondary | ICD-10-CM | POA: Diagnosis not present

## 2023-10-09 NOTE — Telephone Encounter (Signed)
 Patient has been scheduled for preop televisit

## 2023-10-09 NOTE — Telephone Encounter (Signed)
 Patient has been scheduled for televisit and consent is done     Patient Consent for Virtual Visit         Steven Hanson has provided verbal consent on 10/09/2023 for a virtual visit (video or telephone).   CONSENT FOR VIRTUAL VISIT FOR:  Steven Hanson  By participating in this virtual visit I agree to the following:  I hereby voluntarily request, consent and authorize [facility] and its employed or contracted physicians, physician assistants, nurse practitioners or other licensed health care professionals (the Practitioner), to provide me with telemedicine health care services (the "Services") as deemed necessary by the treating Practitioner. I acknowledge and consent to receive the Services by the Practitioner via telemedicine. I understand that the telemedicine visit will involve communicating with the Practitioner through live audiovisual communication technology and the disclosure of certain medical information by electronic transmission. I acknowledge that I have been given the opportunity to request an in-person assessment or other available alternative prior to the telemedicine visit and am voluntarily participating in the telemedicine visit.  I understand that I have the right to withhold or withdraw my consent to the use of telemedicine in the course of my care at any time, without affecting my right to future care or treatment, and that the Practitioner or I may terminate the telemedicine visit at any time. I understand that I have the right to inspect all information obtained and/or recorded in the course of the telemedicine visit and may receive copies of available information for a reasonable fee.  I understand that some of the potential risks of receiving the Services via telemedicine include:  Delay or interruption in medical evaluation due to technological equipment failure or disruption; Information transmitted may not be sufficient (e.g. poor resolution of images) to  allow for appropriate medical decision making by the Practitioner; and/or  In rare instances, security protocols could fail, causing a breach of personal health information.  Furthermore, I acknowledge that it is my responsibility to provide information about my medical history, conditions and care that is complete and accurate to the best of my ability. I acknowledge that Practitioner's advice, recommendations, and/or decision may be based on factors not within their control, such as incomplete or inaccurate data provided by me or distortions of diagnostic images or specimens that may result from electronic transmissions. I understand that the practice of medicine is not an exact science and that Practitioner makes no warranties or guarantees regarding treatment outcomes. I acknowledge that a copy of this consent can be made available to me via my patient portal [facility] MyChart), or I can request a printed copy by calling the office of [facility].    I understand that my insurance will be billed for this visit.   I have read or had this consent read to me. I understand the contents of this consent, which adequately explains the benefits and risks of the Services being provided via telemedicine.  I have been provided ample opportunity to ask questions regarding this consent and the Services and have had my questions answered to my satisfaction. I give my informed consent for the services to be provided through the use of telemedicine in my medical care

## 2023-10-09 NOTE — Telephone Encounter (Signed)
   Pre-operative Risk Assessment    Patient Name: Steven Hanson  DOB: [birth year] MRN: [id number]   Date of last office visit: 12/23/22 Carson Clara, MD Date of next office visit: 01/28/24 Carson Clara, MD   Request for Surgical Clearance    Procedure:   COLONOSCOPY / ENDOSCOPY  Date of Surgery:  Clearance 12/01/23                                Surgeon:  DR Felecia Hopper Surgeon's Group or Practice Name:  [facility] Phone number:  [phone] Fax number:  [phone]   Type of Clearance Requested:   - Medical    Type of Anesthesia:   PROPOFOL    Additional requests/questions:    SignedCollin Deal   10/09/2023, 10:57 AM

## 2023-10-09 NOTE — Telephone Encounter (Signed)
   Name: Steven Hanson  DOB: October 05, 1955  MRN: [id number]  Primary Cardiologist: Wendie Hamburg, MD Last OV: 12/23/22  Preoperative team, please contact this patient and set up a phone call appointment for further preoperative risk assessment. Please obtain consent and complete medication review. Thank you for your help.  I confirm that guidance regarding antiplatelet and oral anticoagulation therapy has been completed and, if necessary, noted below. None requested.   I also confirmed the patient resides in the state of [place] . As per [facility] telemedicine laws, the patient must reside in the state in which the provider is licensed.  [name] D [name], NP 10/09/2023, 1:19 PM [facility]

## 2023-10-14 DIAGNOSIS — K080 Exfoliation of teeth due to systemic causes: Secondary | ICD-10-CM | POA: Diagnosis not present

## 2023-10-21 DIAGNOSIS — Z8546 Personal history of malignant neoplasm of prostate: Secondary | ICD-10-CM | POA: Diagnosis not present

## 2023-10-28 DIAGNOSIS — N393 Stress incontinence (female) (male): Secondary | ICD-10-CM | POA: Diagnosis not present

## 2023-10-28 DIAGNOSIS — Z8546 Personal history of malignant neoplasm of prostate: Secondary | ICD-10-CM | POA: Diagnosis not present

## 2023-10-28 DIAGNOSIS — N5231 Erectile dysfunction following radical prostatectomy: Secondary | ICD-10-CM | POA: Diagnosis not present

## 2023-11-17 ENCOUNTER — Ambulatory Visit: Payer: Self-pay | Attending: Cardiovascular Disease | Admitting: Emergency Medicine

## 2023-11-17 DIAGNOSIS — Z01810 Encounter for preprocedural cardiovascular examination: Secondary | ICD-10-CM

## 2023-11-17 NOTE — Progress Notes (Signed)
 Virtual Visit via Telephone Note   Because of Steven Hanson co-morbid illnesses, he is at least at moderate risk for complications without adequate follow up.  This format is felt to be most appropriate for this patient at this time.  Due to technical limitations with video connection (technology), today's appointment will be conducted as an audio only telehealth visit, and Steven Hanson verbally agreed to proceed in this manner.   All issues noted in this document were discussed and addressed.  No physical exam could be performed with this format.  Evaluation Performed:  Preoperative cardiovascular risk assessment _____________   Date:  11/17/2023   Patient ID:  Steven Hanson, DOB 1955-11-04, MRN [id number] Patient Location:  Home Provider location:   Office  Primary Care Provider:  Alejandro Hurt, FNP Primary Cardiologist:  Steven Hamburg, MD  Chief Complaint / Patient Profile   68 y.o. y/o male with a h/o mitral regurgitation, aortic regurgitation, hyperlipidemia, hypertension, prostate cancer, CKD stage IIIa, coronary artery disease, aortic dilation who is pending colonoscopy/endoscopy with [facility] gastroenterology on 12/01/2023 and presents today for telephonic preoperative cardiovascular risk assessment.  History of Present Illness    Steven Hanson is a 68 y.o. male who presents via audio/video conferencing for a telehealth visit today.  Pt was last seen in cardiology clinic on 12/23/2022 by Dr Steven Hanson.  At that time Steven Hanson was doing well.  The patient is now pending procedure as outlined above. Since his last visit, he denies chest pain, shortness of breath, lower extremity edema, fatigue, palpitations, melena, hematuria, hemoptysis, diaphoresis, weakness, presyncope, syncope, orthopnea, and PND.  Today patient is doing well overall.  He is without any acute cardiovascular concerns or complaints.  Denies any chest pain or exertional symptoms.  No anginal symptoms, no  indication for further ischemic evaluation at this time.  He tells me he is very active.  He walks at least 10-15,000 steps a day, swims regularly, goes to the gym, and golfs at a [place] 3 course at least 3 times a week.  During activity he denies any symptoms.  Overall he is able to complete greater than 4 METS.  Past Medical History    Past Medical History:  Diagnosis Date   Aortic valve insufficiency    11/11/2014 per H&P per Dr Anastasia Balo    Cancer [facility])    GERD (gastroesophageal reflux disease)    Heart murmur    Hyperlipidemia    per H&P per Dr Anastasia Balo 11/11/2014   Hypertension    Mitral valve regurgitation    per H&P per Dr Anastasia Balo 11/11/2014   Vertigo    Wears glasses    Past Surgical History:  Procedure Laterality Date   cyst removed      pt not sure which toe or on which foot 1988-1989   LYMPH NODE DISSECTION Bilateral 06/30/2015   Procedure: BILATERAL PELVIC LYMPH NODE DISSECTION;  Surgeon: Andrez Banker, MD;  Location: [facility];  Service: Urology;  Laterality: Bilateral;   ROBOT ASSISTED LAPAROSCOPIC RADICAL PROSTATECTOMY N/A 06/30/2015   Procedure: ROBOTIC ASSISTED LAPAROSCOPIC RADICAL PROSTATECTOMY;  Surgeon: Andrez Banker, MD;  Location: [facility];  Service: Urology;  Laterality: N/A;    Allergies  Allergies  Allergen Reactions   Lisinopril     Other reaction(s): cough   Losartan      Other reaction(s): elevated potassium    Home Medications    Prior to Admission medications   Medication Sig Start Date End Date Taking? Authorizing Provider  amLODipine  (  NORVASC ) 10 MG tablet Take 1 tablet (10 mg total) by mouth daily. 02/20/23   Steven Hamburg, MD  ezetimibe  (ZETIA ) 10 MG tablet Take 1 tablet (10 mg total) by mouth daily. 12/23/22   Steven Hamburg, MD  latanoprost (XALATAN) 0.005 % ophthalmic solution 1 drop at bedtime.    [provider]  Magnesium  Oxide 400 MG CAPS Take 1 capsule (400 mg total) by mouth daily. 12/24/22   Steven Hamburg, MD  Melatonin 1 MG CAPS 1 capsule at bedtime as needed    [provider]  Omega 3 1000 MG CAPS 1 capsule    [provider]  omeprazole (PRILOSEC OTC) 20 MG tablet 1 tablet 30 minutes before morning meal    [provider]  rosuvastatin  (CRESTOR ) 40 MG tablet Take 1 tablet (40 mg total) by mouth daily. 12/23/22 12/18/23  Steven Hamburg, MD  valsartan  (DIOVAN ) 80 MG tablet Take 1 tablet (80 mg total) by mouth daily. 12/23/22   Steven Hamburg, MD    Physical Exam    Vital Signs:  Steven Hanson does not have vital signs available for review today  Given telephonic nature of communication, physical exam is limited. AAOx3. NAD. Normal affect.  Speech and respirations are unlabored.  Accessory Clinical Findings    None  Assessment & Plan    1.  Preoperative Cardiovascular Risk Assessment: According to the Revised Cardiac Risk Index (RCRI), his Perioperative Risk of Major Cardiac Event is (%): 0.4. His Functional Capacity in METs is: 7.28 according to the [employer] Activity Status Index (DASI).Therefore, based on ACC/AHA guidelines, patient would be at acceptable risk for the planned procedure without further cardiovascular testing.  The patient was advised that if he develops new symptoms prior to surgery to contact our office to arrange for a follow-up visit, and he verbalized understanding.  A copy of this note will be routed to requesting surgeon.  Time:   Today, I have spent 5 minutes with the patient with telehealth technology discussing medical history, symptoms, and management plan.     Steven Boatman, NP  11/17/2023, 1:47 PM

## 2023-11-27 DIAGNOSIS — I700 Atherosclerosis of aorta: Secondary | ICD-10-CM | POA: Diagnosis not present

## 2023-11-27 DIAGNOSIS — J302 Other seasonal allergic rhinitis: Secondary | ICD-10-CM | POA: Diagnosis not present

## 2023-11-27 DIAGNOSIS — I10 Essential (primary) hypertension: Secondary | ICD-10-CM | POA: Diagnosis not present

## 2023-11-27 DIAGNOSIS — E7800 Pure hypercholesterolemia, unspecified: Secondary | ICD-10-CM | POA: Diagnosis not present

## 2023-11-27 DIAGNOSIS — Z0000 Encounter for general adult medical examination without abnormal findings: Secondary | ICD-10-CM | POA: Diagnosis not present

## 2023-11-27 DIAGNOSIS — K219 Gastro-esophageal reflux disease without esophagitis: Secondary | ICD-10-CM | POA: Diagnosis not present

## 2023-11-27 DIAGNOSIS — R7303 Prediabetes: Secondary | ICD-10-CM | POA: Diagnosis not present

## 2023-11-27 DIAGNOSIS — N1831 Chronic kidney disease, stage 3a: Secondary | ICD-10-CM | POA: Diagnosis not present

## 2023-11-27 DIAGNOSIS — R059 Cough, unspecified: Secondary | ICD-10-CM | POA: Diagnosis not present

## 2023-12-01 DIAGNOSIS — K6389 Other specified diseases of intestine: Secondary | ICD-10-CM | POA: Diagnosis not present

## 2023-12-01 DIAGNOSIS — Z09 Encounter for follow-up examination after completed treatment for conditions other than malignant neoplasm: Secondary | ICD-10-CM | POA: Diagnosis not present

## 2023-12-01 DIAGNOSIS — K5730 Diverticulosis of large intestine without perforation or abscess without bleeding: Secondary | ICD-10-CM | POA: Diagnosis not present

## 2023-12-01 DIAGNOSIS — Z860101 Personal history of adenomatous and serrated colon polyps: Secondary | ICD-10-CM | POA: Diagnosis not present

## 2023-12-03 DIAGNOSIS — Z09 Encounter for follow-up examination after completed treatment for conditions other than malignant neoplasm: Secondary | ICD-10-CM | POA: Diagnosis not present

## 2023-12-03 DIAGNOSIS — K6389 Other specified diseases of intestine: Secondary | ICD-10-CM | POA: Diagnosis not present

## 2023-12-03 DIAGNOSIS — K5730 Diverticulosis of large intestine without perforation or abscess without bleeding: Secondary | ICD-10-CM | POA: Diagnosis not present

## 2023-12-24 ENCOUNTER — Other Ambulatory Visit: Payer: Self-pay | Admitting: Cardiology

## 2024-01-08 DIAGNOSIS — K219 Gastro-esophageal reflux disease without esophagitis: Secondary | ICD-10-CM | POA: Diagnosis not present

## 2024-01-28 ENCOUNTER — Ambulatory Visit: Payer: Self-pay | Admitting: Cardiology

## 2024-01-29 DIAGNOSIS — H35033 Hypertensive retinopathy, bilateral: Secondary | ICD-10-CM | POA: Diagnosis not present

## 2024-01-29 DIAGNOSIS — H401132 Primary open-angle glaucoma, bilateral, moderate stage: Secondary | ICD-10-CM | POA: Diagnosis not present

## 2024-02-08 NOTE — Progress Notes (Unsigned)
 Cardiology Office Note:    Date:  02/12/2024   ID:  Steven Hanson, DOB 07/21/1955, MRN [id number]  PCP:  Marvene Prentice SAUNDERS, FNP  Cardiologist:  Lonni LITTIE Nanas, MD  Electrophysiologist:  None   Referring MD: Marvene Prentice SAUNDERS, FNP   Chief Complaint  Patient presents with   Hypertension    History of Present Illness:     Steven Hanson is a 68 y.o. male with a hx of mitral regurgitation, aortic regurgitation, hyperlipidemia, hypertension, prostate cancer who presents for follow-up.  He was referred by Dr. Gwenn for evaluation of valvular heart disease, initially seen on 12/03/2019.    Echocardiogram on 11/01/2019 showed LVEF 60 to 65%, redundant mitral valve cords, mild MR, mild AI, mild PS, ascending aorta dilatation measuring 41 mm.  CTA chest on 12/16/2019 showed max diameter of ascending aorta measured 37 mm, also with small pulmonary nodules.  Calcium  score on 03/21/2020 was 192 (73rd percentile).  Echocardiogram 01/02/2022 showed EF 60 to 65%, mild LVH, normal RV function, mild MR, mild AI, mild PS.  Since last clinic visit, he reports he is doing well.  Denies any chest pain, dyspnea, lightheadedness, syncope, lower extremity edema, or palpitations.  Walks daily for 15K steps per day, denies any exertional symptoms.   Past Medical History:  Diagnosis Date   Aortic valve insufficiency    11/11/2014 per H&P per Dr Blanca    Cancer [facility])    GERD (gastroesophageal reflux disease)    Heart murmur    Hyperlipidemia    per H&P per Dr Blanca 11/11/2014   Hypertension    Mitral valve regurgitation    per H&P per Dr Blanca 11/11/2014   Vertigo    Wears glasses     Past Surgical History:  Procedure Laterality Date   cyst removed      pt not sure which toe or on which foot 1988-1989   LYMPH NODE DISSECTION Bilateral 06/30/2015   Procedure: BILATERAL PELVIC LYMPH NODE DISSECTION;  Surgeon: Morene LELON Salines, MD;  Location: [facility];  Service: Urology;  Laterality: Bilateral;   ROBOT  ASSISTED LAPAROSCOPIC RADICAL PROSTATECTOMY N/A 06/30/2015   Procedure: ROBOTIC ASSISTED LAPAROSCOPIC RADICAL PROSTATECTOMY;  Surgeon: Morene LELON Salines, MD;  Location: [facility];  Service: Urology;  Laterality: N/A;    Current Medications: Current Meds  Medication Sig   albuterol (VENTOLIN HFA) 108 (90 Base) MCG/ACT inhaler Inhale 2 puffs into the lungs every 6 (six) hours as needed for wheezing or shortness of breath.   amLODipine  (NORVASC ) 10 MG tablet Take 1 tablet (10 mg total) by mouth daily.   ezetimibe  (ZETIA ) 10 MG tablet Take 1 tablet by mouth once daily   latanoprost (XALATAN) 0.005 % ophthalmic solution 1 drop at bedtime.   Magnesium  Oxide 400 MG CAPS Take 1 capsule (400 mg total) by mouth daily.   Melatonin 1 MG CAPS 1 capsule at bedtime as needed   Omega 3 1000 MG CAPS 1 capsule   omeprazole (PRILOSEC OTC) 20 MG tablet 1 tablet 30 minutes before morning meal   pantoprazole  (PROTONIX ) 20 MG tablet Take 20 mg by mouth daily.   rosuvastatin  (CRESTOR ) 40 MG tablet Take 1 tablet by mouth once daily   valsartan  (DIOVAN ) 80 MG tablet Take 1 tablet by mouth once daily     Allergies:   Lisinopril and Losartan    Social History   Socioeconomic History   Marital status: Widowed    Spouse name: Not on file   Number of children: Not  on file   Years of education: Not on file   Highest education level: Not on file  Occupational History   Not on file  Tobacco Use   Smoking status: Never   Smokeless tobacco: Never  Substance and Sexual Activity   Alcohol use: Yes    Comment: several glasses of wine on weekend   Drug use: No   Sexual activity: Not on file  Other Topics Concern   Not on file  Social History Narrative   Not on file   Social Drivers of Health   Financial Resource Strain: Not on file  Food Insecurity: Not on file  Transportation Needs: Not on file  Physical Activity: Not on file  Stress: Not on file  Social Connections: Not on file     Family History: The  patient's family history is not on file.  ROS:   Please see the history of present illness.     All other systems reviewed and are negative.  EKGs/Labs/Other Studies Reviewed:    The following studies were reviewed today:   EKG:   02/12/24: NSR, LAD, rate 77 12/23/22: NSR, LAD, rate 72, LVH 12/21/2021: Normal sinus rhythm, rate 71, left axis deviation, nonspecific intraventricular conduction delay, no ST abnormalities 04/24/21-normal sinus rhythm, rate 79, no ST abnormalities, T wave inversion in lead III 4/22- normal sinus rhythm, rate 83, no ST abnormalities 6/21- normal sinus rhythm, rate 82, no ST/T abnormalities  Recent Labs: 02/19/2023: BUN 15; Creatinine, Ser 1.41; Magnesium  2.0; Potassium 4.9; Sodium 134  Recent Lipid Panel    Component Value Date/Time   CHOL 150 04/24/2021 1653   TRIG 180 (H) 04/24/2021 1653   HDL 42 04/24/2021 1653   CHOLHDL 3.6 04/24/2021 1653   LDLCALC 77 04/24/2021 1653    Physical Exam:    VS:  BP 128/80 (BP Location: Left Arm, Patient Position: Sitting, Cuff Size: Normal)   Pulse 77   Ht 5' 10 (1.778 m)   Wt 189 lb (85.7 kg)   SpO2 98%   BMI 27.12 kg/m     Wt Readings from Last 3 Encounters:  02/12/24 189 lb (85.7 kg)  12/23/22 187 lb (84.8 kg)  12/21/21 188 lb 9.6 oz (85.5 kg)     GEN: Well nourished, well developed in no acute distress HEENT: Normal NECK: No JVD; No carotid bruits LYMPHATICS: No lymphadenopathy CARDIAC: RRR, no murmurs, rubs, gallops RESPIRATORY:  Clear to auscultation without rales, wheezing or rhonchi  ABDOMEN: Soft, non-tender, non-distended MUSCULOSKELETAL:  No edema; No deformity  SKIN: Warm and dry NEUROLOGIC:  Alert and oriented x 3 PSYCHIATRIC:  Normal affect   ASSESSMENT:    1. Essential hypertension   2. Mitral valve insufficiency, unspecified etiology   3. Aortic valve insufficiency, etiology of cardiac valve disease unspecified   4. Hyperlipidemia, unspecified hyperlipidemia type   5. CKD stage  3a, GFR 45-59 ml/min (HCC)      PLAN:    Valvular heart disease: Mild AI, mild MR, mild PS on TTE 11/01/2019.  Repeat echocardiogram 12/2021 showed stable valvular disease, with mild AI/MR/PS. -Plan repeat echo next year  Aortic dilatation: Ascending aorta measured 41 mm on echocardiogram 11/01/2019.  CTA chest on 12/16/2019 showed max diameter of ascending aorta measured 37 mm  Hypertension: Developed hyperkalemia (K 5.9) on losartan , was switched to amlodipine .  Developed hypomagnesemia on chlorthalidone . Currently on amlodipine  10 mg daily and valsartan  80 mg daily.  BP appears controlled.  Check BMET, magnesium     Hyperlipidemia: LDL 148 on  10/07/2019.  10-year ASCVD risk score is 14%.  Switched from pravastatin  20 mg to rosuvastatin  10 mg daily in June 2021.  LDL 109 on 01/07/2020.  Calcium  score on 03/21/2020 was 192 (73rd percentile).  Switched to rosuvastatin  20 mg daily.  LDL 113 on 09/26/2020, rosuvastatin  increased to 40 mg daily.  LDL 77 on 04/24/2021.  Zetia  10 mg daily added.  LDL 59 on 11/27/2023  CKD stage IIIa: Creatinine 1.4 on 11/2023.  Check BMET, magnesium   Lung nodule: Small lung nodules on CT chest 12/2019.  Repeat CT chest 12/2020 showed lung nodule has resolved, suspect likely infectious etiology.  However was a new lung nodule seen, follow-up chest CT recommended in 1 year.  CT chest on 01/2022 showed nodule seen on prior study was not present, no follow-up recommended.  RTC in 1 year   Medication Adjustments/Labs and Tests Ordered: Current medicines are reviewed at length with the patient today.  Concerns regarding medicines are outlined above.  Orders Placed This Encounter  Procedures   Basic Metabolic Panel (BMET)   Magnesium    EKG 12-Lead   ECHOCARDIOGRAM COMPLETE    No orders of the defined types were placed in this encounter.    Patient Instructions  Medication Instructions:  Continue current medications *If you need a refill on your cardiac medications  before your next appointment, please call your pharmacy*  Lab Work: Bmet, mg today If you have labs (blood work) drawn today and your tests are completely normal, you will receive your results only by: MyChart Message (if you have MyChart) OR A paper copy in the mail If you have any lab test that is abnormal or we need to change your treatment, we will call you to review the results.  Testing/Procedures: Echo to be done in one year  Your physician has requested that you have an echocardiogram. Echocardiography is a painless test that uses sound waves to create images of your heart. It provides your doctor with information about the size and shape of your heart and how well your heart's chambers and valves are working. This procedure takes approximately one hour. There are no restrictions for this procedure. Please do NOT wear cologne, perfume, aftershave, or lotions (deodorant is allowed). Please arrive 15 minutes prior to your appointment time.  Please note: We ask at that you not bring children with you during ultrasound (echo/ vascular) testing. Due to room size and safety concerns, children are not allowed in the ultrasound rooms during exams. Our front office staff cannot provide observation of children in our lobby area while testing is being conducted. An adult accompanying a patient to their appointment will only be allowed in the ultrasound room at the discretion of the ultrasound technician under special circumstances. We apologize for any inconvenience.   Follow-Up: At [facility], you and your health needs are our priority.  As part of our continuing mission to provide you with exceptional heart care, our providers are all part of one team.  This team includes your primary Cardiologist (physician) and Advanced Practice Providers or APPs (Physician Assistants and Nurse Practitioners) who all work together to provide you with the care you need, when you need it.  Your next  appointment:   1 year post ECHO  Provider:   Dr. Kate  We recommend signing up for the patient portal called MyChart.  Sign up information is provided on this After Visit Summary.  MyChart is used to connect with patients for Virtual Visits (Telemedicine).  Patients  are able to view lab/test results, encounter notes, upcoming appointments, etc.  Non-urgent messages can be sent to your provider as well.   To learn more about what you can do with MyChart, go to [email or web address].   Other Instructions none       Signed, Lonni LITTIE Nanas, MD  02/12/2024 11:37 AM     [facility]

## 2024-02-12 ENCOUNTER — Ambulatory Visit: Payer: Self-pay | Attending: Cardiology | Admitting: Cardiology

## 2024-02-12 ENCOUNTER — Encounter: Payer: Self-pay | Admitting: Cardiology

## 2024-02-12 VITALS — BP 128/80 | HR 77 | Ht 70.0 in | Wt 189.0 lb

## 2024-02-12 DIAGNOSIS — E785 Hyperlipidemia, unspecified: Secondary | ICD-10-CM | POA: Diagnosis not present

## 2024-02-12 DIAGNOSIS — N1831 Chronic kidney disease, stage 3a: Secondary | ICD-10-CM | POA: Diagnosis present

## 2024-02-12 DIAGNOSIS — I10 Essential (primary) hypertension: Secondary | ICD-10-CM | POA: Diagnosis not present

## 2024-02-12 DIAGNOSIS — I340 Nonrheumatic mitral (valve) insufficiency: Secondary | ICD-10-CM

## 2024-02-12 DIAGNOSIS — I351 Nonrheumatic aortic (valve) insufficiency: Secondary | ICD-10-CM

## 2024-02-12 NOTE — Patient Instructions (Signed)
 Medication Instructions:  Continue current medications *If you need a refill on your cardiac medications before your next appointment, please call your pharmacy*  Lab Work: Bmet, mg today If you have labs (blood work) drawn today and your tests are completely normal, you will receive your results only by: MyChart Message (if you have MyChart) OR A paper copy in the mail If you have any lab test that is abnormal or we need to change your treatment, we will call you to review the results.  Testing/Procedures: Echo to be done in one year  Your physician has requested that you have an echocardiogram. Echocardiography is a painless test that uses sound waves to create images of your heart. It provides your doctor with information about the size and shape of your heart and how well your heart's chambers and valves are working. This procedure takes approximately one hour. There are no restrictions for this procedure. Please do NOT wear cologne, perfume, aftershave, or lotions (deodorant is allowed). Please arrive 15 minutes prior to your appointment time.  Please note: We ask at that you not bring children with you during ultrasound (echo/ vascular) testing. Due to room size and safety concerns, children are not allowed in the ultrasound rooms during exams. Our front office staff cannot provide observation of children in our lobby area while testing is being conducted. An adult accompanying a patient to their appointment will only be allowed in the ultrasound room at the discretion of the ultrasound technician under special circumstances. We apologize for any inconvenience.   Follow-Up: At [facility], you and your health needs are our priority.  As part of our continuing mission to provide you with exceptional heart care, our providers are all part of one team.  This team includes your primary Cardiologist (physician) and Advanced Practice Providers or APPs (Physician Assistants and Nurse  Practitioners) who all work together to provide you with the care you need, when you need it.  Your next appointment:   1 year post ECHO  Provider:   Dr. Kate  We recommend signing up for the patient portal called MyChart.  Sign up information is provided on this After Visit Summary.  MyChart is used to connect with patients for Virtual Visits (Telemedicine).  Patients are able to view lab/test results, encounter notes, upcoming appointments, etc.  Non-urgent messages can be sent to your provider as well.   To learn more about what you can do with MyChart, go to [email or web address].   Other Instructions none

## 2024-02-13 ENCOUNTER — Ambulatory Visit: Payer: Self-pay | Admitting: Cardiology

## 2024-02-13 LAB — BASIC METABOLIC PANEL WITH GFR
BUN/Creatinine Ratio: 11 (ref 10–24)
BUN: 16 mg/dL (ref 8–27)
CO2: 22 mmol/L (ref 20–29)
Calcium: 9.8 mg/dL (ref 8.6–10.2)
Chloride: 99 mmol/L (ref 96–106)
Creatinine, Ser: 1.41 mg/dL — AB (ref 0.76–1.27)
Glucose: 86 mg/dL (ref 70–99)
Potassium: 4.8 mmol/L (ref 3.5–5.2)
Sodium: 137 mmol/L (ref 134–144)
eGFR: 55 mL/min/1.73 — AB (ref >59)

## 2024-02-13 LAB — MAGNESIUM: Magnesium: 2.1 mg/dL (ref 1.6–2.3)

## 2024-02-18 DIAGNOSIS — Z6828 Body mass index (BMI) 28.0-28.9, adult: Secondary | ICD-10-CM | POA: Diagnosis not present

## 2024-02-18 DIAGNOSIS — M25511 Pain in right shoulder: Secondary | ICD-10-CM | POA: Diagnosis not present

## 2024-03-03 ENCOUNTER — Other Ambulatory Visit: Payer: Self-pay | Admitting: Cardiology

## 2024-03-10 DIAGNOSIS — M549 Dorsalgia, unspecified: Secondary | ICD-10-CM | POA: Diagnosis not present

## 2024-03-23 ENCOUNTER — Other Ambulatory Visit (HOSPITAL_COMMUNITY)

## 2024-03-30 DIAGNOSIS — M546 Pain in thoracic spine: Secondary | ICD-10-CM | POA: Diagnosis not present

## 2024-04-04 ENCOUNTER — Other Ambulatory Visit: Payer: Self-pay | Admitting: Cardiology

## 2024-04-13 DIAGNOSIS — K080 Exfoliation of teeth due to systemic causes: Secondary | ICD-10-CM | POA: Diagnosis not present

## 2024-04-13 DIAGNOSIS — M546 Pain in thoracic spine: Secondary | ICD-10-CM | POA: Diagnosis not present

## 2024-04-20 DIAGNOSIS — K080 Exfoliation of teeth due to systemic causes: Secondary | ICD-10-CM | POA: Diagnosis not present

## 2025-02-10 ENCOUNTER — Other Ambulatory Visit (HOSPITAL_COMMUNITY)
